# Patient Record
Sex: Male | Born: 2000 | Race: White | Hispanic: No | Marital: Single | Smoking: Never smoker
Health system: Southern US, Community
[De-identification: ages and names within clinical notes are randomized; demographics above are authoritative.]

## PROBLEM LIST (undated history)

## (undated) ENCOUNTER — Emergency Department (HOSPITAL_COMMUNITY): Admission: EM | Payer: Self-pay | Source: Home / Self Care

## (undated) DIAGNOSIS — R41840 Attention and concentration deficit: Secondary | ICD-10-CM

## (undated) DIAGNOSIS — I10 Essential (primary) hypertension: Secondary | ICD-10-CM

## (undated) HISTORY — DX: Attention and concentration deficit: R41.840

## (undated) HISTORY — DX: Essential (primary) hypertension: I10

---
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
## 2019-03-18 ENCOUNTER — Other Ambulatory Visit: Payer: Self-pay

## 2019-03-18 ENCOUNTER — Telehealth: Payer: Self-pay | Admitting: *Deleted

## 2019-03-18 ENCOUNTER — Ambulatory Visit: Payer: Medicaid Other | Attending: Internal Medicine

## 2019-03-18 DIAGNOSIS — Z20822 Contact with and (suspected) exposure to covid-19: Secondary | ICD-10-CM

## 2019-03-18 NOTE — Telephone Encounter (Signed)
Pt called to check status of covid 19 test result. He is advised that results are not back yet. Assisted with setting up his MyChart app.

## 2019-03-20 LAB — NOVEL CORONAVIRUS, NAA: SARS-CoV-2, NAA: NOT DETECTED

## 2019-03-20 NOTE — Telephone Encounter (Signed)
Patient called to check result of COVID test- notified still pending- patient does have MyChart-- reassured he will see result when complete.

## 2019-09-28 ENCOUNTER — Emergency Department (HOSPITAL_COMMUNITY)
Admission: EM | Admit: 2019-09-28 | Discharge: 2019-09-29 | Disposition: A | Discharge status: Home / Self Care | Payer: Medicaid Other | Attending: Emergency Medicine | Admitting: Emergency Medicine

## 2019-09-28 ENCOUNTER — Encounter (HOSPITAL_COMMUNITY): Payer: Self-pay | Admitting: Student

## 2019-09-28 ENCOUNTER — Other Ambulatory Visit: Payer: Self-pay

## 2019-09-28 DIAGNOSIS — Z87891 Personal history of nicotine dependence: Secondary | ICD-10-CM | POA: Diagnosis not present

## 2019-09-28 DIAGNOSIS — I10 Essential (primary) hypertension: Secondary | ICD-10-CM | POA: Insufficient documentation

## 2019-09-28 DIAGNOSIS — Z20822 Contact with and (suspected) exposure to covid-19: Secondary | ICD-10-CM | POA: Insufficient documentation

## 2019-09-28 DIAGNOSIS — R45851 Suicidal ideations: Secondary | ICD-10-CM | POA: Insufficient documentation

## 2019-09-28 DIAGNOSIS — Z0279 Encounter for issue of other medical certificate: Secondary | ICD-10-CM | POA: Diagnosis present

## 2019-09-28 LAB — RAPID URINE DRUG SCREEN, HOSP PERFORMED
Amphetamines: NOT DETECTED
Barbiturates: NOT DETECTED
Benzodiazepines: NOT DETECTED
Cocaine: NOT DETECTED
Opiates: NOT DETECTED
Tetrahydrocannabinol: POSITIVE — AB

## 2019-09-28 LAB — CBC
HCT: 50 % (ref 39.0–52.0)
Hemoglobin: 16.5 g/dL (ref 13.0–17.0)
MCH: 28.7 pg (ref 26.0–34.0)
MCHC: 33 g/dL (ref 30.0–36.0)
MCV: 87 fL (ref 80.0–100.0)
Platelets: ADEQUATE 10*3/uL (ref 150–400)
RBC: 5.75 MIL/uL (ref 4.22–5.81)
RDW: 13.2 % (ref 11.5–15.5)
WBC: 9.7 10*3/uL (ref 4.0–10.5)
nRBC: 0 % (ref 0.0–0.2)

## 2019-09-28 LAB — COMPREHENSIVE METABOLIC PANEL
ALT: 15 U/L (ref 0–44)
AST: 18 U/L (ref 15–41)
Albumin: 4.8 g/dL (ref 3.5–5.0)
Alkaline Phosphatase: 40 U/L (ref 38–126)
Anion gap: 11 (ref 5–15)
BUN: 10 mg/dL (ref 6–20)
CO2: 22 mmol/L (ref 22–32)
Calcium: 9.5 mg/dL (ref 8.9–10.3)
Chloride: 106 mmol/L (ref 98–111)
Creatinine, Ser: 0.9 mg/dL (ref 0.61–1.24)
GFR calc Af Amer: >60 mL/min (ref >60)
GFR calc non Af Amer: >60 mL/min (ref >60)
Glucose, Bld: 119 mg/dL — ABNORMAL HIGH (ref 70–99)
Potassium: 3.2 mmol/L — ABNORMAL LOW (ref 3.5–5.1)
Sodium: 139 mmol/L (ref 135–145)
Total Bilirubin: 1.1 mg/dL (ref 0.3–1.2)
Total Protein: 7.6 g/dL (ref 6.5–8.1)

## 2019-09-28 LAB — ACETAMINOPHEN LEVEL: Acetaminophen (Tylenol), Serum: <10 ug/mL — ABNORMAL LOW (ref 10–30)

## 2019-09-28 LAB — SALICYLATE LEVEL: Salicylate Lvl: <7 mg/dL — ABNORMAL LOW (ref 7.0–30.0)

## 2019-09-28 LAB — ETHANOL: Alcohol, Ethyl (B): <10 mg/dL (ref <10)

## 2019-09-28 MED ORDER — POTASSIUM CHLORIDE CRYS ER 20 MEQ PO TBCR
40.0000 meq | EXTENDED_RELEASE_TABLET | ORAL | Status: AC
  Administered 2019-09-28 (×2): 40 meq via ORAL
  Filled 2019-09-28 (×2): qty 2

## 2019-09-28 MED ORDER — POTASSIUM CHLORIDE CRYS ER 20 MEQ PO TBCR: 40.0000 meq | EXTENDED_RELEASE_TABLET | Freq: Once | ORAL | Status: DC

## 2019-09-28 MED ORDER — ONDANSETRON HCL 4 MG PO TABS: 4.0000 mg | ORAL_TABLET | Freq: Three times a day (TID) | ORAL | Status: DC | PRN

## 2019-09-28 MED ORDER — ACETAMINOPHEN 325 MG PO TABS: 650.0000 mg | ORAL_TABLET | ORAL | Status: DC | PRN

## 2019-09-28 MED ORDER — ZOLPIDEM TARTRATE 5 MG PO TABS: 5.0000 mg | ORAL_TABLET | Freq: Every evening | ORAL | Status: DC | PRN

## 2019-09-28 MED ORDER — ALUM & MAG HYDROXIDE-SIMETH 200-200-20 MG/5ML PO SUSP: 30.0000 mL | Freq: Four times a day (QID) | ORAL | Status: DC | PRN

## 2019-09-28 NOTE — BH Assessment (Signed)
Comprehensive Clinical Assessment (CCA) Note  09/28/2019 Benjamin Bryant [id number]  Visit Diagnosis: F32.2, Major depressive disorder, Single episode, Severe; F10.20, Alcohol use disorder, Severe; F12.20, Cannabis use disorder, Severe  CCA Screening, Triage and Referral (STR) Benjamin Bryant is a 19 year old patient who was voluntarily brought to [facility] via [facility] after a friend called the [facility] due to pt telling his friend that he had been drinking and that he probably wouldn't make it home tonight. Pt states, "I told my buddy that I probably wouldn't make it home tonight--I'd been drinking. It was recommended that I come down here. Me and my girlfriend broke up about 7 months ago. My sister died several months ago - it was sudden. She overdosed. I don't know what I was gonna do. I'd felt down the past 2-3 days--I don't know what I was going to do." Pt acknowledged that he'd been having thoughts of wanting to end his life the past 2-3 months; he denies he had a plan.  Pt denies any HI, AVH, NSSIB, access to guns/weapons, or engagement with the legal system. He states he has been drinking daily/nightly the last week, which is different for him, as he never drinks; he states he has been drinking around 2 12-packs of 12-ounce beers/night. Pt states he averages the use of 2 ounces of marijuana a week.  Pt's protective factors include the lack of HI, AVH, and consistent housing and employment. His mother currently lives with him.  Pt declined to provide verbal consent for clinician to contact a friend/family member for collateral information.  Pt is oriented x5. His recent/remote memory is intact. Pt was friendly and cooperative throughout the assessment process. Pt's insight is fair; his judgement and impulse control is poor.   Patient Reported Information How did you hear about [place]? Other (Comment) (Pt was brought to the ED by the [facility])  Referral name: [facility] Department  Referral phone  number: No data recorded  Whom do you see for routine medical problems? Primary Care  Practice/Facility Name: [facility] in [place]  Practice/Facility Phone Number: No data recorded Name of Contact: Pt cannot remember  Contact Number: N/A  Contact Fax Number: N/A  Prescriber Name: N/A  Prescriber Address (if known): N/A   What Is the Reason for Your Visit/Call Today? Pt was drinking and planned to leave his home, telling his friend he probably wouldn't make it home tonight.  How Long Has This Been Causing You Problems? > than 6 months (7 months)  What Do You Feel Would Help You the Most Today? No data recorded  Have You Recently Been in Any Inpatient Treatment (Hospital/Detox/Crisis Center/28-Day Program)? No  Name/Location of Program/Hospital:No data recorded How Long Were You There? No data recorded When Were You Discharged? No data recorded  Have You Ever Received Services From [facility] Before? No  Who Do You See at [facility]? No data recorded  Have You Recently Had Any Thoughts About Hurting Yourself? Yes  Are You Planning to Commit Suicide/Harm Yourself At This time? No data recorded  Have you Recently Had Thoughts About Hurting Someone [name]? No  Explanation: No data recorded  Have You Used Any Alcohol or Drugs in the Past 24 Hours? Yes  How Long Ago Did You Use Drugs or Alcohol? 1800  What Did You Use and How Much? EtOH, unsure how long ago he used marijuana   Do You Currently Have a Therapist/Psychiatrist? No  Name of Therapist/Psychiatrist: No data recorded  Have You Been  Recently Discharged From Any Public relations account executive or Programs? No  Explanation of Discharge From Practice/Program: No data recorded    CCA Screening Triage Referral Assessment Type of Contact: Tele-Assessment  Is this Initial or Reassessment? Initial Assessment  Date Telepsych consult ordered in [facility]:  09/28/19  Time Telepsych consult ordered in [facility]:  1836   Patient  Reported Information Reviewed? Yes  Patient Left Without Being Seen? No data recorded Reason for Not Completing Assessment: No data recorded  Collateral Involvement: Pt declined to provide verbal consent for clinician to contact any friends/family members   Does Patient Have a Court Appointed Legal Guardian? No data recorded Name and Contact of Legal Guardian: No data recorded If Minor and Not Living with Parent(s), Who has Custody? N/A  Is CPS involved or ever been involved? Never  Is APS involved or ever been involved? Never   Patient Determined To Be At Risk for Harm To Self or Others Based on Review of Patient Reported Information or Presenting Complaint? Yes, for Self-Harm  Method: No data recorded Availability of Means: No data recorded Intent: No data recorded Notification Required: No data recorded Additional Information for Danger to Others Potential: No data recorded Additional Comments for Danger to Others Potential: No data recorded Are There Guns or Other Weapons in Your Home? No data recorded Types of Guns/Weapons: No data recorded Are These Weapons Safely Secured?                            No data recorded Who Could Verify You Are Able To Have These Secured: No data recorded Do You Have any Outstanding Charges, Pending Court Dates, Parole/Probation? No data recorded Contacted To Inform of Risk of Harm To Self or Others: Law Enforcement Mudlogger is aware)   Location of Assessment: [facility]   Does Patient Present under Involuntary Commitment? No  IVC Papers Initial File Date: No data recorded  [place] of Residence: [place]   Patient Currently Receiving the Following Services: Not Receiving Services   Determination of Need: Emergent (2 hours)   Options For Referral: Inpatient Hospitalization     CCA Biopsychosocial  Intake/Chief Complaint:  CCA Intake With Chief Complaint CCA Part Two Date: 09/28/19 CCA Part Two Time: 2039 Chief  Complaint/Presenting Problem: Pt had been drinking at home and was planning to leave, telling his friend that he probably wouldn't make it home tonight. Patient's Currently Reported Symptoms/Problems: Pt states he has been feeling hopeless, worthless, guilty, and having difficulties sleeping. Individual's Strengths: Pt has been able to maintain employment. He loves and cares about his family. Individual's Preferences: N/A Individual's Abilities: Pt is able to work and provide for himself. Type of Services Patient Feels Are Needed: Pt is unsure of the services he needs but is open to whatever is recommended, including inpatient hospitalization. Initial Clinical Notes/Concerns: Pt has never had services in the past and is at risk of killing himself.  Mental Health Symptoms Depression:  Depression: Change in energy/activity, Fatigue, Hopelessness, Irritability, Worthlessness  Mania:  Mania: None  Anxiety:   Anxiety: Worrying  Psychosis:  Psychosis: None  Trauma:  Trauma: Guilt/shame, Difficulty staying/falling asleep  Obsessions:  Obsessions: None  Compulsions:  Compulsions: None  Inattention:  Inattention: None  Hyperactivity/Impulsivity:  Hyperactivity/Impulsivity: N/A  Oppositional/Defiant Behaviors:  Oppositional/Defiant Behaviors: None  Emotional Irregularity:  Emotional Irregularity: (P) None  Other Mood/Personality Symptoms:  Other Mood/Personality Symptoms: (P) N/A   Mental Status Exam Appearance and self-care  Stature:  Stature: (P) Average  Weight:  Weight: (P) Average weight  Clothing:  Clothing: (P)  (Pt is in scrubs)  Grooming:  Grooming: (P) Normal  Cosmetic use:  Cosmetic Use: (P) None  Posture/gait:  Posture/Gait: (P) Normal  Motor activity:  Motor Activity: (P) Not Remarkable  Sensorium  Attention:  Attention: (P) Normal  Concentration:  Concentration: (P) Normal  Orientation:  Orientation: (P) X5  Recall/memory:  Recall/Memory: (P) Normal  Affect and Mood  Affect:   Affect: (P) Appropriate  Mood:  Mood: (P) Depressed  Relating  Eye contact:  Eye Contact: (P) Normal  Facial expression:  Facial Expression: (P) Responsive  Attitude toward examiner:  Attitude Toward Examiner: (P) Cooperative  Thought and Language  Speech flow: Speech Flow: (P) Normal  Thought content:  Thought Content: (P) Appropriate to Mood and Circumstances  Preoccupation:  Preoccupations: (P) None  Hallucinations:  Hallucinations: (P) None  Organization:     Company secretaryxecutive Functions  Fund of Knowledge:     Intelligence:     Abstraction:     Judgement:     Reality Testing:     Insight:     Decision Making:     Social Functioning  Social Maturity:     Social Judgement:     Stress  Stressors:     Coping Ability:     Skill Deficits:     Supports:        Religion: Religion/Spirituality Are You A Religious Person?: Yes What is Your Religious Affiliation?: Environmental consultantBaptist  Leisure/Recreation: Leisure / Recreation Do You Have Hobbies?: No  Exercise/Diet: Exercise/Diet Do You Exercise?: No Have You Gained or Lost A Significant Amount of Weight in the Past Six Months?:  (Gained around 20 lbs and then lost 25 - 30 lbs within 6-7 months) Do You Follow a Special Diet?: No Do You Have Any Trouble Sleeping?: Yes Explanation of Sleeping Difficulties: Has difficulties falling and staying asleep   CCA Employment/Education  Employment/Work Situation: Employment / Work Situation Employment situation: Employed Where is patient currently employed?: N/A How long has patient been employed?: N/A Patient's job has been impacted by current illness: Yes Describe how patient's job has been impacted: N/A What is the longest time patient has a held a job?: N/A Where was the patient employed at that time?: N/A Has patient ever been in the [employer] and Companymilitary?: No  Education: Education Is Patient Currently Attending School?: No Last Grade Completed:  (N/A) Name of High School: N/A Did Garment/textile technologistYou Graduate  From [employer]?:  (N/A) Did You Attend College?:  (N/A) Did You Attend Graduate School?:  (N/A) Did You Have Any Special Interests In School?: N/A Did You Have An Individualized Education Program (IIEP):  (N/A) Did You Have Any Difficulty At School?:  (N/A) Patient's Education Has Been Impacted by Current Illness:  (N/A)   CCA Family/Childhood History  Family and Relationship History: Family history Marital status: Single Are you sexually active?:  (N/A) What is your sexual orientation?: N/A Has your sexual activity been affected by drugs, alcohol, medication, or emotional stress?: N/A Does patient have children?: No  Childhood History:  Childhood History By whom was/is the patient raised?: Both parents Additional childhood history information: N/A Description of patient's relationship with caregiver when they were a child: N/A Patient's description of current relationship with people who raised him/her: Pt's parents separated 3 years ago; it was difficult for him initially but it has gotten easier for him to cope with now. How were you disciplined when you  got in trouble as a child/adolescent?: N/A Does patient have siblings?: Yes Number of Siblings: 4 Description of patient's current relationship with siblings: Pt is close with all of his siblings; one sister died 7 months ago from an o/d. Did patient suffer any verbal/emotional/physical/sexual abuse as a child?: No Did patient suffer from severe childhood neglect?: No Has patient ever been sexually abused/assaulted/raped as an adolescent or adult?: No Was the patient ever a victim of a crime or a disaster?: No Witnessed domestic violence?: No Has patient been affected by domestic violence as an adult?: No  Child/Adolescent Assessment:     CCA Substance Use  Alcohol/Drug Use:  ASAM's:  Six Dimensions of Multidimensional Assessment  Dimension 1:  Acute Intoxication and/or Withdrawal Potential:      Dimension 2:   Biomedical Conditions and Complications:      Dimension 3:  Emotional, Behavioral, or Cognitive Conditions and Complications:     Dimension 4:  Readiness to Change:     Dimension 5:  Relapse, Continued use, or Continued Problem Potential:     Dimension 6:  Recovery/Living Environment:     ASAM Severity Score:    ASAM Recommended Level of Treatment:     Substance use Disorder (SUD)    Recommendations for Services/Supports/Treatments: Nira Conn, NP, reviewed pt's chart and information and determined pt meets inpatient criteria. Pt's referral information is currently pending at [facility]. This information was provided to pt's EDP, Dr. Effie Shy, PA-C, Harvie Heck, and RN, [employer] via internal messenger at [phone].  DSM5 Diagnoses: Patient Active Problem List   Diagnosis Date Noted  . Attention deficit hyperactivity disorder (ADHD) 07/16/2015    Patient Centered Plan: Patient is on the following Treatment Plan(s):  Depression and Substance Abuse   Referrals to Alternative Service(s): Referred to Alternative Service(s):   Place:   Date:   Time:    Referred to Alternative Service(s):   Place:   Date:   Time:    Referred to Alternative Service(s):   Place:   Date:   Time:    Referred to Alternative Service(s):   Place:   Date:   Time:     Ralph Dowdy

## 2019-09-28 NOTE — ED Notes (Signed)
TTS in progress 

## 2019-09-28 NOTE — ED Triage Notes (Signed)
Patient comes to the ED accompanied by [facility] voluntarily after telling his friend he "may not make it home tonight".  Patient friend called [employer] who recommended patient come to the ED for evaluation.  Patient calm cooperative in triage. Patient reports he felt like a lot of personal things are happening at once, with his family and girlfriend and had a plan to drink and "drive around".

## 2019-09-28 NOTE — ED Notes (Addendum)
Pt reports he broke up with his girlfriend of 7 years about a month ago   Reports he told his friend he might not make it home tonight   Friend call police who encouraged and escorted pt here   He reports he was just talking  He has no hx of in/out pt   No SI/HI A/V/T hallucinations  And is not threatening self harm as he was just talking   Urine obtained

## 2019-09-28 NOTE — ED Provider Notes (Signed)
[facility] EMERGENCY DEPARTMENT Provider Note   CSN: [id number] Arrival date & time: 09/28/19  1713     History Chief Complaint  Patient presents with  . Medical Clearance    Benjamin Bryant is a 19 y.o. male with a history of hypertension & ADHD who presents to the ED by police for evaluation of suicidal ideation.  Patient states that he has had problems with depression for the past several months with intermittent thoughts of suicide.  He states he had a lot of personal things going on and had a break-up with a girlfriend that really upset him.  He states that tonight he had a few alcoholic beverages and then decided to drive, he is unsure if he was trying to commit suicide by doing this, he was contemplating it.  No other attempts made.  No other alleviating or aggravating factors.  He was suicidal upon arrival, however upon my assessment the patient he does not actively want to commit suicide.  He denies homicidal ideation or hallucinations.  Utilizes marijuana, no other drug use.  HPI     Past Medical History:  Diagnosis Date  . Attention deficit   . Hypertension     Patient Active Problem List   Diagnosis Date Noted  . Attention deficit hyperactivity disorder (ADHD) 07/16/2015    No past surgical history on file.     Family History  Problem Relation Age of Onset  . Hypertension Mother   . COPD Father   . COPD Maternal Grandmother   . Hypertension Maternal Grandmother   . Heart disease Maternal Grandmother   . Hypertension Paternal Grandmother   . Cancer Paternal Grandmother     Social History   Tobacco Use  . Smoking status: Never Smoker  . Smokeless tobacco: Former Clinical biochemist  . Vaping Use: Never used  Substance Use Topics  . Alcohol use: No    Comment: Occassionally  . Drug use: No    Home Medications Prior to Admission medications   Medication Sig Start Date End Date Taking? Authorizing Provider  amphetamine-dextroamphetamine (ADDERALL) 20  MG tablet Take 20 mg by mouth 3 (three) times daily.    [provider]  cyclobenzaprine (FLEXERIL) 5 MG tablet Take 1 tablet (5 mg total) by mouth 3 (three) times daily as needed for muscle spasms. 04/11/18   Burgess Amor, PA-C  famotidine (PEPCID) 20 MG tablet Take 1 tablet (20 mg total) by mouth 2 (two) times daily. 10/28/14   Devoria Albe, MD  ibuprofen (ADVIL,MOTRIN) 600 MG tablet Take 1 tablet (600 mg total) by mouth every 6 (six) hours as needed. 04/11/18   Burgess Amor, PA-C  loperamide (IMODIUM) 2 MG capsule Take 1 capsule (2 mg total) by mouth 4 (four) times daily as needed for diarrhea or loose stools. 09/04/17   Mesner, Barbara Cower, MD  naproxen (NAPROSYN) 500 MG tablet Take 1 tablet (500 mg total) by mouth 2 (two) times daily. 12/24/18   Chryl Holten R, PA-C  ondansetron (ZOFRAN) 4 MG tablet Take 1 tablet (4 mg total) by mouth every 8 (eight) hours as needed for nausea or vomiting. 09/04/17   Mesner, Barbara Cower, MD    Allergies    Patient has no known allergies.  Review of Systems   Review of Systems  Constitutional: Negative for chills and fever.  Respiratory: Negative for shortness of breath.   Cardiovascular: Negative for chest pain.  Gastrointestinal: Negative for abdominal pain.  Neurological: Negative for syncope.  Psychiatric/Behavioral: Positive for  suicidal ideas. Negative for hallucinations.  All other systems reviewed and are negative.   Physical Exam Updated Vital Signs BP (!) 144/83 (BP Location: Right Arm)   Pulse 65   Temp 98.8 F (37.1 C) (Oral)   Resp 16   Ht 6\' 2"  (1.88 m)   Wt 86.2 kg   SpO2 100%   BMI 24.39 kg/m   Physical Exam Vitals and nursing note reviewed.  Constitutional:      General: He is not in acute distress.    Appearance: He is well-developed. He is not toxic-appearing.  HENT:     Head: Normocephalic and atraumatic.  Eyes:     General:        Right eye: No discharge.        Left eye: No discharge.     Conjunctiva/sclera:  Conjunctivae normal.  Cardiovascular:     Rate and Rhythm: Normal rate and regular rhythm.  Pulmonary:     Effort: Pulmonary effort is normal. No respiratory distress.     Breath sounds: Normal breath sounds. No wheezing, rhonchi or rales.  Abdominal:     General: There is no distension.     Palpations: Abdomen is soft.     Tenderness: There is no abdominal tenderness.  Musculoskeletal:     Cervical back: Neck supple.  Skin:    General: Skin is warm and dry.     Findings: No rash.  Neurological:     Mental Status: He is alert.     Comments: Clear speech.   Psychiatric:        Mood and Affect: Affect is flat.        Behavior: Behavior normal.     ED Results / Procedures / Treatments   Labs (all labs ordered are listed, but only abnormal results are displayed) Labs Reviewed  COMPREHENSIVE METABOLIC PANEL - Abnormal; Notable for the following components:      Result Value   Potassium 3.2 (*)    Glucose, Bld 119 (*)    All other components within normal limits  SALICYLATE LEVEL - Abnormal; Notable for the following components:   Salicylate Lvl <7.0 (*)    All other components within normal limits  ACETAMINOPHEN LEVEL - Abnormal; Notable for the following components:   Acetaminophen (Tylenol), Serum <10 (*)    All other components within normal limits  RAPID URINE DRUG SCREEN, HOSP PERFORMED - Abnormal; Notable for the following components:   Tetrahydrocannabinol POSITIVE (*)    All other components within normal limits  SARS CORONAVIRUS 2 BY RT PCR (HOSPITAL ORDER, PERFORMED IN [facility])  ETHANOL  CBC    EKG None  Radiology No results found.  Procedures Procedures (including critical care time)  Medications Ordered in ED Medications - No data to display  ED Course  I have reviewed the triage vital signs and the nursing notes.  Pertinent labs & imaging results that were available during my care of the patient were reviewed by me and considered  in my medical decision making (see chart for details). \ Benjamin Bryant was evaluated in Emergency Department on 09/28/2019 for the symptoms described in the history of present illness. He/she was evaluated in the context of the global COVID-19 pandemic, which necessitated consideration that the patient might be at risk for infection with the SARS-CoV-2 virus that causes COVID-19. Institutional protocols and algorithms that pertain to the evaluation of patients at risk for COVID-19 are in a state of rapid change based on  information released by regulatory bodies including the CDC and federal and state organizations. These policies and algorithms were followed during the patient's care in the ED.    MDM Rules/Calculators/A&P                         Patient presents to the ED for psychiatric evaluation.  Patient is nontoxic, resting comfortably, vitals within normal limits with the exception of elevated blood pressure, low suspicion for hypertensive emergency.  Physical exam otherwise benign.  Flat affect noted..   Additional history obtained:  Additional history obtained from nursing note & chart review. Lab Tests:  I Ordered, reviewed, and interpreted labs, which included:  CBC, CMP, ethanol level, UDS, salicylate level, and acetaminophen level, fairly unremarkable, mild hypokalemia, oral replacement ordered.  UDS consistent with patient reported marijuana use. Covid test is pending  ED Course:  Patient medically cleared for TTS evaluation.  Disposition per [facility].   The patient has been placed in psychiatric observation due to the need to provide a safe environment for the patient while obtaining psychiatric consultation and evaluation, as well as ongoing medical and medication management to treat the patient's condition.  The patient has not been placed under full IVC at this time.  Portions of this note were generated with Scientist, clinical (histocompatibility and immunogenetics). Dictation errors may occur despite best  attempts at proofreading.  Final Clinical Impression(s) / ED Diagnoses Final diagnoses:  None    Rx / DC Orders ED Discharge Orders    None       Desmond Lope 09/28/19 Jaynee Eagles, MD 09/29/19 1137

## 2019-09-28 NOTE — Progress Notes (Signed)
Pt accepted to [facility] to room 399-01 to the service of MD Clary after 0800 hours, pending negative COVID result.  Report may be called to [phone] when transportation is arranged.

## 2019-09-28 NOTE — ED Notes (Signed)
Dressed patient in burgundy scrubs. Patients belongings are locked in the locker.

## 2019-09-29 ENCOUNTER — Inpatient Hospital Stay (HOSPITAL_COMMUNITY)
Admission: AD | Admit: 2019-09-29 | Discharge: 2019-10-01 | DRG: 885 | Disposition: A | Discharge status: Home / Self Care | Payer: Medicaid Other | Source: Intra-hospital | Attending: Psychiatry | Admitting: Psychiatry

## 2019-09-29 ENCOUNTER — Other Ambulatory Visit: Payer: Self-pay

## 2019-09-29 ENCOUNTER — Encounter (HOSPITAL_COMMUNITY): Payer: Self-pay | Admitting: Nurse Practitioner

## 2019-09-29 DIAGNOSIS — G4700 Insomnia, unspecified: Secondary | ICD-10-CM | POA: Diagnosis present

## 2019-09-29 DIAGNOSIS — F411 Generalized anxiety disorder: Secondary | ICD-10-CM | POA: Diagnosis present

## 2019-09-29 DIAGNOSIS — R45851 Suicidal ideations: Secondary | ICD-10-CM | POA: Diagnosis present

## 2019-09-29 DIAGNOSIS — F322 Major depressive disorder, single episode, severe without psychotic features: Secondary | ICD-10-CM | POA: Diagnosis present

## 2019-09-29 DIAGNOSIS — F909 Attention-deficit hyperactivity disorder, unspecified type: Secondary | ICD-10-CM | POA: Diagnosis present

## 2019-09-29 DIAGNOSIS — I10 Essential (primary) hypertension: Secondary | ICD-10-CM | POA: Diagnosis present

## 2019-09-29 DIAGNOSIS — Z818 Family history of other mental and behavioral disorders: Secondary | ICD-10-CM

## 2019-09-29 LAB — SARS CORONAVIRUS 2 BY RT PCR (HOSPITAL ORDER, PERFORMED IN ~~LOC~~ HOSPITAL LAB): SARS Coronavirus 2: NEGATIVE

## 2019-09-29 MED ORDER — ALUM & MAG HYDROXIDE-SIMETH 200-200-20 MG/5ML PO SUSP: 30.0000 mL | ORAL | Status: DC | PRN

## 2019-09-29 MED ORDER — ENSURE ENLIVE PO LIQD
237.0000 mL | Freq: Two times a day (BID) | ORAL | Status: DC
  Administered 2019-09-30 (×2): 237 mL via ORAL

## 2019-09-29 MED ORDER — HYDROXYZINE HCL 25 MG PO TABS: 25.0000 mg | ORAL_TABLET | Freq: Three times a day (TID) | ORAL | Status: DC | PRN

## 2019-09-29 MED ORDER — ACETAMINOPHEN 325 MG PO TABS
650.0000 mg | ORAL_TABLET | Freq: Four times a day (QID) | ORAL | Status: DC | PRN
  Administered 2019-10-01: 650 mg via ORAL
  Filled 2019-09-29: qty 2

## 2019-09-29 MED ORDER — MAGNESIUM HYDROXIDE 400 MG/5ML PO SUSP: 30.0000 mL | Freq: Every day | ORAL | Status: DC | PRN

## 2019-09-29 MED ORDER — TRAZODONE HCL 50 MG PO TABS: 50.0000 mg | ORAL_TABLET | Freq: Every evening | ORAL | Status: DC | PRN

## 2019-09-29 NOTE — ED Notes (Signed)
Attempted to call Drue Flirt ( mother ) to give update but no answer or voicemail

## 2019-09-29 NOTE — ED Notes (Signed)
Pt requesting shower. Provided him with body wash, shampoo, and lotion per request. Towel, wash cloth, and clean scrubs provided.

## 2019-09-29 NOTE — Progress Notes (Signed)
The patient had nothing to share with regards to his day. His goal for tomorrow is to stop drinking and using marijuana.

## 2019-09-29 NOTE — ED Notes (Signed)
Pt. Currently in shower, 1-1 monitoring from outside bathroom door.

## 2019-09-29 NOTE — ED Notes (Signed)
Transportation is going to take a little longer d/t raining.

## 2019-09-29 NOTE — ED Notes (Signed)
Pt has been resting quietly. °

## 2019-09-29 NOTE — ED Notes (Signed)
Meal tray provided for patient. Finger foods only on tray

## 2019-09-29 NOTE — BH Assessment (Addendum)
Patient referred to the following hospitals for consideration of bed placement:  [facility]  [facility]  [facility]  [facility]  [facility]  [facility]  [facility]  [facility]  [facility]  [facility]  [facility]  [facility]

## 2019-09-29 NOTE — Progress Notes (Signed)
Patient ID: Benjamin Bryant, male   DOB: Mar 17, 2000, 19 y.o.   MRN: [id number] D: Pt here voluntarily from [facility]. Pt denies HI/AVH and pain at this time. Pt endorses SI without a plan. Pt agrees to approach staff if he feels like harming himself. Pt endorses helplessness and hopelessness. Pt has lost 20 pounds in the past month due to a decreased appetite. Pt lost his sister to OD death a few months ago. His mother lives with him and now so does his deceases sister's 3 children. Pt broke up with his girlfriend 7 months ago after a 7 year relationship. Pt stated he began drinking alcohol about a week ago for the first time. Pt denies even having a desire to drink. Pt endorses drinking 14 beers yesterday and even more in the days preceding that. Pt endorses marijuana use and vapes.  Pt is looking for counseling. Pt wants someone to talk to so he came here. This is his first admission at [facility] according to pt. Pt takes Adderall but has not taken it in the past 2 days. A: Pt was offered support and encouragement. Pt is cooperative during assessment. VS assessed and admission paperwork signed. Belongings searched and contraband items placed in locker. Non-invasive skin search completed: pt has tattoos on both arms. Pt offered food and drink and both accepted. Pt introduced to unit milieu by nursing staff. Q 15 minute checks were started for safety.  R: Pt in room eating. Pt safety maintained on unit.

## 2019-09-29 NOTE — ED Notes (Signed)
Pt belongings give to RN at nurses station for transportation. Pt shoes will be given to wear to the [facility].

## 2019-09-29 NOTE — ED Notes (Addendum)
Transportation on hold d/t [facility] doesn't have a empty room yet. [facility] will call when available.

## 2019-09-29 NOTE — ED Notes (Signed)
Nurse spoke with [facility] and pt room is ready. Room 305-1. Transportation will be called again.

## 2019-09-29 NOTE — Progress Notes (Addendum)
   09/29/19 1925  Psych Admission Type (Psych Patients Only)  Admission Status Voluntary  Psychosocial Assessment  Patient Complaints Hopelessness;Helplessness  Eye Contact Fair  Facial Expression Sad  Affect Appropriate to circumstance  Speech Logical/coherent  Interaction Assertive  Motor Activity Other (Comment) (WNL)  Appearance/Hygiene Unremarkable;In scrubs  Behavior Characteristics Cooperative  Mood Depressed;Sad  Thought Process  Coherency WDL  Content WDL  Delusions None reported or observed  Perception WDL  Hallucination None reported or observed  Judgment Poor  Confusion None  Danger to Self  Current suicidal ideation? Passive  Self-Injurious Behavior No self-injurious ideation or behavior indicators observed or expressed   Agreement Not to Harm Self Yes  Description of Agreement verbal agreement to approach staff  Danger to Others  Danger to Others None reported or observed   Pt pleasant. Pt states he takes Adderall daily but has not taken it in the past 2 days. Pt has history of hypertension and ADHD. Pt also has fractured  Vertebrae in his lower back d/t workplace fall at his last job about 3-4 months ago. Pt states he has some weakness in his legs but has not had any falls or near falls because of it.

## 2019-09-29 NOTE — ED Notes (Signed)
Pt is resting at this time

## 2019-09-29 NOTE — ED Notes (Signed)
Patient's mother called and spoke with him.  Mother's number written down for patient.  Mother Drue Flirt) [phone]

## 2019-09-29 NOTE — Tx Team (Signed)
Initial Treatment Plan 09/29/2019 11:00 PM HERSHY FLENNER [id number]    PATIENT STRESSORS: Loss of sister to OD several months ago; breakup with GF 7 months ago   PATIENT STRENGTHS: Average or above average intelligence Capable of independent living Communication skills Motivation for treatment/growth Supportive family/friends   PATIENT IDENTIFIED PROBLEMS: Suicidal ideation  Depression   Breakup with GF 7 months ago  Sister OD death several months ago               DISCHARGE CRITERIA:  Improved stabilization in mood, thinking, and/or behavior Motivation to continue treatment in a less acute level of care Verbal commitment to aftercare and medication compliance  PRELIMINARY DISCHARGE PLAN: Attend aftercare/continuing care group Attend PHP/IOP Outpatient therapy Return to previous living arrangement Return to previous work or school arrangements  PATIENT/FAMILY INVOLVEMENT: This treatment plan has been presented to and reviewed with the patient, GLOVER CAPANO, and/or family member.  The patient and family have been given the opportunity to ask questions and make suggestions.  Victorino December, RN 09/29/2019, 11:00 PM

## 2019-09-30 DIAGNOSIS — F322 Major depressive disorder, single episode, severe without psychotic features: Principal | ICD-10-CM

## 2019-09-30 LAB — LIPID PANEL
Cholesterol: 115 mg/dL (ref 0–200)
HDL: 46 mg/dL (ref >40)
LDL Cholesterol: 56 mg/dL (ref 0–99)
Total CHOL/HDL Ratio: 2.5 RATIO
Triglycerides: 63 mg/dL (ref <150)
VLDL: 13 mg/dL (ref 0–40)

## 2019-09-30 LAB — HEMOGLOBIN A1C
Hgb A1c MFr Bld: 4.8 % (ref 4.8–5.6)
Mean Plasma Glucose: 91.06 mg/dL

## 2019-09-30 LAB — TSH: TSH: 2.082 u[IU]/mL (ref 0.350–4.500)

## 2019-09-30 MED ORDER — LORAZEPAM 1 MG PO TABS: 1.0000 mg | ORAL_TABLET | Freq: Four times a day (QID) | ORAL | Status: DC | PRN

## 2019-09-30 MED ORDER — ADULT MULTIVITAMIN W/MINERALS CH
1.0000 | ORAL_TABLET | Freq: Every day | ORAL | Status: DC
  Administered 2019-09-30 – 2019-10-01 (×2): 1 via ORAL
  Filled 2019-09-30 (×5): qty 1

## 2019-09-30 MED ORDER — THIAMINE HCL 100 MG PO TABS
100.0000 mg | ORAL_TABLET | Freq: Every day | ORAL | Status: DC
  Administered 2019-10-01: 100 mg via ORAL
  Filled 2019-09-30 (×3): qty 1

## 2019-09-30 MED ORDER — CITALOPRAM HYDROBROMIDE 10 MG PO TABS
10.0000 mg | ORAL_TABLET | Freq: Every day | ORAL | Status: DC
  Administered 2019-09-30 – 2019-10-01 (×2): 10 mg via ORAL
  Filled 2019-09-30 (×5): qty 1

## 2019-09-30 MED ORDER — LOPERAMIDE HCL 2 MG PO CAPS: 2.0000 mg | ORAL_CAPSULE | ORAL | Status: DC | PRN

## 2019-09-30 MED ORDER — ONDANSETRON 4 MG PO TBDP: 4.0000 mg | ORAL_TABLET | Freq: Four times a day (QID) | ORAL | Status: DC | PRN

## 2019-09-30 MED ORDER — THIAMINE HCL 100 MG/ML IJ SOLN
100.0000 mg | Freq: Once | INTRAMUSCULAR | Status: DC
Start: 2019-09-30 — End: 2019-09-30

## 2019-09-30 MED ORDER — HYDROXYZINE HCL 25 MG PO TABS
25.0000 mg | ORAL_TABLET | Freq: Four times a day (QID) | ORAL | Status: DC | PRN
  Administered 2019-09-30: 25 mg via ORAL
  Filled 2019-09-30: qty 1

## 2019-09-30 NOTE — Progress Notes (Signed)
The patient shared in group this evening that he learned about anti depressants today. His goal for tomorrow is to get better and to get discharged.

## 2019-09-30 NOTE — Progress Notes (Signed)
NUTRITION ASSESSMENT RD working remotely.  Pt identified as at risk on the Malnutrition Screen Tool  INTERVENTION: - continue Ensure Enlive BID, each supplement provides 350 kcal and 20 grams of protein.  NUTRITION DIAGNOSIS: Unintentional weight loss related to sub-optimal intake as evidenced by pt report.   Goal: Pt to meet >/= 90% of their estimated nutrition needs.  Monitor:  PO intake  Assessment:  Patient admitted for SI without a plan with feelings of hopelessness and helplessness. Notes indicate patient reported his sister died after an OD a few months ago. He and his former girlfriend of 7 years broke up 7 months ago. He reported to staff that he drinks 14 beers the day PTA and more than that in the previous days.   He reported to [facility] staff that he has had a decreased appetite and that he has lost 20 lb in the past 1 month.   Weight today is 184 lb and PTA the most recently documented weight was on 12/24/18 when he weighed 199 lb. This indicates 15 lb weight loss (7.5% body weight).   Ensure Enlive ordered BID but has not yet been offered.   19 y.o. male  Height: Ht Readings from Last 1 Encounters:  09/29/19 6\' 2"  (1.88 m)    Weight: Wt Readings from Last 1 Encounters:  09/30/19 83.6 kg    Weight Hx: Wt Readings from Last 10 Encounters:  09/30/19 83.6 kg  09/28/19 86.2 kg  12/24/18 90.7 kg (92 %, Z= 1.42)*  04/11/18 86.2 kg (89 %, Z= 1.23)*  09/04/17 88.5 kg (92 %, Z= 1.42)*  03/27/17 78 kg (80 %, Z= 0.86)*  07/16/15 90.8 kg (97 %, Z= 1.91)*  10/28/14 86.2 kg (97 %, Z= 1.88)*  05/24/14 75.3 kg (92 %, Z= 1.41)*  12/19/13 71.2 kg (91 %, Z= 1.31)*   * Growth percentiles are based on CDC (Boys, 2-20 Years) data.    BMI:  Body mass index is 23.66 kg/m. Pt meets criteria for normal weight based on current BMI.  Estimated Nutritional Needs: Kcal: 25-30 kcal/kg Protein: > 1 gram protein/kg Fluid: 1 ml/kcal  Diet Order:  Diet Order            Diet  regular Room service appropriate? Yes; Fluid consistency: Thin  Diet effective now                Pt is also offered choice of unit snacks mid-morning and mid-afternoon.  Pt is eating as desired.   Lab results and medications reviewed.      02/18/14, MS, RD, LDN, CNSC Inpatient Clinical Dietitian RD pager # available in [facility]  After hours/weekend pager # available in [facility]

## 2019-09-30 NOTE — Progress Notes (Signed)
D:  Patient's self inventory sheet, patient has fair sleep, no sleep medication.  Poor appetite, low energy level, poor concentration.  Rated depression, hopeless and anxiety #9.  Denied withdrawals, diarrhea, cramping, agitation, nausea listed.  Denied SI.  Physical pain, pain, worst pain #5 in past 24 hours.  Goal is feel better.  No discharge plans. A:  Medications administered per MD orders.  Emotional support and encouragement given patient. R:  Patient denied SI and HI, contracts for safety.   Denied A/V hallucinations.  Safety maintained with 15 minute checks.

## 2019-09-30 NOTE — Plan of Care (Signed)
Nurse discussed anxiety, depression and coping skills with patient.  

## 2019-09-30 NOTE — Progress Notes (Signed)
Progress note  Pt states they spoke with their employer and they told the pt they could only cover their job until Thursday. Pt expresses concern with leaving today or tomorrow, the 21st, as they just acquired this job and need it.

## 2019-09-30 NOTE — Progress Notes (Signed)
Recreation Therapy Notes  Animal-Assisted Activity (AAA) Program Checklist/Progress Notes Patient Eligibility Criteria Checklist & Daily Group note for Rec Tx Intervention  Date: 7.20.21 Time: 1430 Location: 300 Hall Group Room   AAA/T Program Assumption of Risk Form signed by Patient/ or Parent Legal Guardian  YES   Patient is free of allergies or sever asthma  YES   Patient reports no fear of animals  YES   Patient reports no history of cruelty to animals  YES  Patient understands his/her participation is voluntary  YES   Patient washes hands before animal contact  YES  Patient washes hands after animal contact  YES    Behavioral Response: Engaged  Education: Hand Washing, Appropriate Animal Interaction   Education Outcome: Acknowledges understanding/In group clarification offered/Needs additional education.   Clinical Observations/Feedback: Pt attended and participated in group activity.    Athalee Esterline, LRT/CTRS         Destanie Tibbetts A 09/30/2019 3:21 PM 

## 2019-09-30 NOTE — BHH Suicide Risk Assessment (Signed)
[facility] Admission Suicide Risk Assessment   Nursing information obtained from:  Patient Demographic factors:  Male, Caucasian, Unemployed, Adolescent or young adult Current Mental Status:  Suicidal ideation indicated by patient Loss Factors:  Loss of significant relationship Historical Factors:  NA Risk Reduction Factors:  Positive social support, Employed, Sense of responsibility to family, Living with another person, especially a relative, Responsible for children under 43 years of age  Total Time spent with patient: 45 minutes Principal Problem:Diagnosis:  Active Problems:   Severe major depression, single episode, without psychotic features (HCC)  Subjective Data:   Continued Clinical Symptoms:  Alcohol Use Disorder Identification Test Final Score (AUDIT): 4 The "Alcohol Use Disorders Identification Test", Guidelines for Use in Primary Care, Second Edition.  World Science writer [facility]). Score between 0-7:  no or low risk or alcohol related problems. Score between 8-15:  moderate risk of alcohol related problems. Score between 16-19:  high risk of alcohol related problems. Score 20 or above:  warrants further diagnostic evaluation for alcohol dependence and treatment.   CLINICAL FACTORS:  19 year old male, presented to ED on 7/18 reporting worsening depression, suicidal ideations (described as passive/no plan or intention), neurovegetative symptoms, and heavy drinking (up to 24 beers per day) over the last week or 2 (without prior history of alcohol use disorder).  He reports he has been feeling depressed following break-up with girlfriend about 6 months ago.  He also reports a sister passed away from an accidental drug overdose a few weeks prior. He reports history of ADHD for which he has been prescribed Adderall since adolescence but states he has not been taking over recent days.    Psychiatric Specialty Exam: Physical Exam  Review of Systems  Blood pressure 117/88, pulse 80,  temperature 98.5 F (36.9 C), temperature source Oral, resp. rate 18, height 6\' 2"  (1.88 m), weight 83.6 kg.Body mass index is 23.66 kg/m.  See admit note MSE    COGNITIVE FEATURES THAT CONTRIBUTE TO RISK:  Closed-mindedness and Loss of executive function    SUICIDE RISK:   Moderate:  Frequent suicidal ideation with limited intensity, and duration, some specificity in terms of plans, no associated intent, good self-control, limited dysphoria/symptomatology, some risk factors present, and identifiable protective factors, including available and accessible social support.  PLAN OF CARE: Patient will be admitted to inpatient psychiatric unit for stabilization and safety. Will provide and encourage milieu participation. Provide medication management and maked adjustments as needed.  We will also provide medication management to address alcohol withdrawal if needed will follow daily.    I certify that inpatient services furnished can reasonably be expected to improve the patient's condition.   , MD 09/30/2019, 12:29 PM

## 2019-09-30 NOTE — BHH Counselor (Signed)
Adult Comprehensive Assessment  Patient ID: LEOPOLDO MAZZIE, male   DOB: [birth year], 19 y.o.   MRN: [id number]  Information Source: Information source: Patient  Current Stressors:  Patient states their primary concerns and needs for treatment are:: Pt states he drank 12pk beer and was going to drive his vehicle while intoxicated and his friend called the police. Pt reports he has been drinking heavily for the past 3 days. Pt states main stressors are recent loss of his sister and a recent break up with girlfriend Patient states their goals for this hospitilization and ongoing recovery are:: "To feel better" Educational / Learning stressors: None reported Employment / Job issues: None reported Family Relationships: No stressor reported Surveyor, quantity / Lack of resources (include bankruptcy): Limited income Housing / Lack of housing: None reported Physical health (include injuries & life threatening diseases): None reported Social relationships: No stressor reported Substance abuse: Pt reports daily marijuana use(6-7 blunts per day), alcohol use(24 beers per day) Bereavement / Loss: Sister passed away 2 months ago  Living/Environment/Situation:  Living Arrangements: Alone Who else lives in the home?: Pt, mother How long has patient lived in current situation?: 7 months What is atmosphere in current home: Comfortable, Supportive  Family History:  Marital status: Long term relationship Long term relationship, how long?: 7 yrs What types of issues is patient dealing with in the relationship?: Pt reports recent break up with girlfriend, had been dating 7 yrs. Pt states his girlfriend moved to [place] and said "she wasnt happy anymore" Does patient have children?: No  Childhood History:  By whom was/is the patient raised?: Both parents Additional childhood history information: Pt reports parents are separated and says he does not speak with them about things that are bothering him Description of  patient's relationship with caregiver when they were a child: "Good" Patient's description of current relationship with people who raised him/her: "Good" Number of Siblings: 3 Description of patient's current relationship with siblings: 2 bros, 1 sis limited interaction. Pt states he was closest to his deceased sister Did patient suffer any verbal/emotional/physical/sexual abuse as a child?: No Did patient suffer from severe childhood neglect?: No Has patient ever been sexually abused/assaulted/raped as an adolescent or adult?: No Was the patient ever a victim of a crime or a disaster?: No Witnessed domestic violence?: No Has patient been affected by domestic violence as an adult?: No  Education:  Highest grade of school patient has completed: 9th Currently a student?: No Learning disability?: No  Employment/Work Situation:   Employment situation: Employed Where is patient currently employed?: [employer] How long has patient been employed?: 3 weeks Patient's job has been impacted by current illness: Yes Describe how patient's job has been impacted: Pt states he wakes up and is not motivated to go to work What is the longest time patient has a held a job?: 2.5 yrs 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 [place] Has patient ever been in the [employer]?: No  Financial Resources:   Surveyor, quantity resources: [employer], Income from employment Does patient have a Lawyer or guardian?: No  Alcohol/Substance Abuse:   What has been your use of drugs/alcohol within the last 12 months?: Alcohol, marijuana If attempted suicide, did drugs/alcohol play a role in this?: Yes Alcohol/Substance Abuse Treatment Hx: Denies past history Has alcohol/substance abuse ever caused legal problems?: No  Social Support System:   [employer] Support System: None Type of faith/religion: Ephriam Knuckles How does patient's faith help to  cope with current  illness?: "It doesnt"  Leisure/Recreation:      Strengths/Needs:   What is the patient's perception of their strengths?: "Smoking marijuana" Patient states they can use these personal strengths during their treatment to contribute to their recovery: "I dont know"  Discharge Plan:   Currently receiving community mental health services: No Patient states concerns and preferences for aftercare planning are: Pt denies having a mental health provider and request referral for outpatient treatment Patient states they will know when they are safe and ready for discharge when: "I dont know" Does patient have access to transportation?: Yes Does patient have financial barriers related to discharge medications?: No Will patient be returning to same living situation after discharge?: Yes  Summary/Recommendations:   Summary and Recommendations (to be completed by the evaluator): Pt is a 19 yr old male who voluntarily presents to the [facility] ED after a friend called the police due to the patient trying to drive while under the influence of alcohol. Pt states he had drunk 12 beers. Pt reports increase in alcohol use stems from recent loss of his sister and recent break up with girlfriend. Pt reports a history of alcohol and marijuana use. Pt denies having a mental health provider and request referral for outpatient treatment. Pt states this is his first inpatient psychiatric hospitalization. Recommendations for pt include: crisis stabilization, therapeutic milieu, encourage group attendance and participation, medication management for mood stabilization, and development for comprehensive mental wellness plan. CSW assessing for appropriate referrals.  Tejas Seawood Philip Aspen. 09/30/2019

## 2019-09-30 NOTE — H&P (Signed)
Psychiatric Admission Assessment Adult  Patient Identification: Benjamin Bryant MRN:  [id number] Date of Evaluation:  09/30/2019 Chief Complaint: Depression, Anxiety Principal Diagnosis: MDD, no psychotic features  Diagnosis:  Active Problems:   Severe major depression, single episode, without psychotic features (HCC)  History of Present Illness: 3y old male, presented to ED on 7/18 ( voluntarily, at encouragement of friends), after expressing suicidal ideations . Patient reports he has been experiencing suicidal ideations recently, which he describes as passive ( states " I didn't care whatever happened") . States he has been feeling depressed over the last 6-7 months following break up with GF, whom he had been dating for several years. States they continued amicable relationship after break up but states " she stopped talking to me a few weeks ago". He also reports that one of his sisters passed away a few weeks ago from an accidental drug overdose . He denies prior history of alcohol use disorder but states that over the last week he has been drinking up to 24 beers per day. ( admission BAL negative, UDS positive for cannabis)  Endorses neuro-vegetative symptoms of depression as below. Denies psychotic symptoms.  Associated Signs/Symptoms: Depression Symptoms:  depressed mood, anhedonia, insomnia, suicidal thoughts without plan, anxiety, panic attacks, loss of energy/fatigue, decreased appetite, reports has lost close to 20 lbs over recent weeks (Hypo) Manic Symptoms: none endorsed or noted at this time Anxiety Symptoms: reports increased anxiety recently, with increased sense of apprehension and recent panic attacks Psychotic Symptoms: denies  PTSD Symptoms: Denies  Total Time spent with patient: 45 minutes  Past Psychiatric History: no prior psychiatric admissions . Denies prior history of suicide attempts, denies history of self cutting.Denies prior history of severe depressive  episodes and as noted states he has felt depressed over recent months following break up with GF .  He reports history of anxiety, but characterizes as mild up to recently, when he has had increased worry/anxiety and some panic attacks.Denies anhedonia. Denies history of mania or hypomania. Reports history of ADHD for which he states he has been prescribed Adderall for several years.  Is the patient at risk to self? Yes.    Has the patient been a risk to self in the past 6 months? No.  Has the patient been a risk to self within the distant past? No.  Is the patient a risk to others? No.  Has the patient been a risk to others in the past 6 months? No.  Has the patient been a risk to others within the distant past? No.   Prior Inpatient Therapy:  denies  Prior Outpatient Therapy:  states Adderall is prescribed by PCP  Alcohol Screening: 1. How often do you have a drink containing alcohol?: Monthly or less (just started drinking this week) 2. How many drinks containing alcohol do you have on a typical day when you are drinking?: 5 or 6 3. How often do you have six or more drinks on one occasion?: Less than monthly AUDIT-C Score: 4 4. How often during the last year have you found that you were not able to stop drinking once you had started?: Never 5. How often during the last year have you failed to do what was normally expected from you because of drinking?: Never 6. How often during the last year have you needed a first drink in the morning to get yourself going after a heavy drinking session?: Never 7. How often during the last year have you had a feeling  of guilt of remorse after drinking?: Never 8. How often during the last year have you been unable to remember what happened the night before because you had been drinking?: Never 9. Have you or someone else been injured as a result of your drinking?: No 10. Has a relative or friend or a doctor or another health worker been concerned about  your drinking or suggested you cut down?: No Alcohol Use Disorder Identification Test Final Score (AUDIT): 4 Alcohol Brief Interventions/Follow-up: AUDIT Score <7 follow-up not indicated Substance Abuse History in the last 12 months:  Smokes cannabis regularly, often daily. Denies other drug abuse . Denies prior history of alcohol use disorder but states has been drinking daily, heavily over the last 1-2 weeks.  Reports long term treatment with Adderall for ADHD, which he denies abusing or misusing . Consequences of Substance Abuse: Denies history of seizures or DTs, no history of DUIs, history of alcohol blackout in the past  Previous Psychotropic Medications: Reports he was taking Adderall 20 mgrs TID. Reports he has been on this medication for years , for history of ADHD Psychological Evaluations: No Past Medical History: Denies medical illnesses . NKDA . Past Medical History:  Diagnosis Date  . Attention deficit   . Hypertension    History reviewed. No pertinent surgical history. Family History: parents alive, separated, has 2 brother and 1 brother. A sister passed away 2 months ago from drug overdose   Family History  Problem Relation Age of Onset  . Hypertension Mother   . COPD Father   . COPD Maternal Grandmother   . Hypertension Maternal Grandmother   . Heart disease Maternal Grandmother   . Hypertension Paternal Grandmother   . Cancer Paternal Grandmother    Family Psychiatric  History: reports brother has Bipolar Disorder. Brother has history of alcohol use disorder. *Sister passed away from overdose 2 months ago- patient reports he believes this was accidental  Tobacco Screening: vapes tobacco product  Social History: 19, single, no children, employed, lives with mother Social History   Substance and Sexual Activity  Alcohol Use Yes   Comment: drank 14 cans of beer yesterday; just started drinking a week ago     Social History   Substance and Sexual Activity  Drug Use  Yes  . Types: Marijuana   Comment: 2 oz a week    Additional Social History: Marital status: Long term relationship Long term relationship, how long?: 7 yrs What types of issues is patient dealing with in the relationship?: Pt reports recent break up with girlfriend, had been dating 7 yrs. Pt states his girlfriend moved to [place] and said "she wasnt happy anymore" Does patient have children?: No  Allergies:  No Known Allergies Lab Results:  Results for orders placed or performed during the hospital encounter of 09/29/19 (from the past 48 hour(s))  Hemoglobin A1c     Status: None   Collection Time: 09/30/19  6:29 AM  Result Value Ref Range   Hgb A1c MFr Bld 4.8 4.8 - 5.6 %    Comment: (NOTE) Pre diabetes:          5.7%-6.4%  Diabetes:              >6.4%  Glycemic control for   <7.0% adults with diabetes    Mean Plasma Glucose 91.06 mg/dL    Comment: Performed at [facility], [address] [place], [place] [address]  Lipid panel     Status: None   Collection  Time: 09/30/19  6:29 AM  Result Value Ref Range   Cholesterol 115 0 - 200 mg/dL   Triglycerides 63 <300 mg/dL   HDL 46 >92 mg/dL   Total CHOL/HDL Ratio 2.5 RATIO   VLDL 13 0 - 40 mg/dL   LDL Cholesterol 56 0 - 99 mg/dL    Comment:        Total Cholesterol/HDL:CHD Risk Coronary Heart Disease Risk Table                     Men   Women  1/2 Average Risk   3.4   3.3  Average Risk       5.0   4.4  2 X Average Risk   9.6   7.1  3 X Average Risk  23.4   11.0        Use the calculated Patient Ratio above and the CHD Risk Table to determine the patient's CHD Risk.        ATP III CLASSIFICATION (LDL):  <100     mg/dL   Optimal  330-076  mg/dL   Near or Above                    Optimal  130-159  mg/dL   Borderline  226-333  mg/dL   High  >545     mg/dL   Very High Performed at [facility], [address], [place], [place] [address]   TSH     Status: None   Collection Time: 09/30/19  6:29  AM  Result Value Ref Range   TSH 2.082 0.350 - 4.500 uIU/mL    Comment: Performed by a 3rd Generation assay with a functional sensitivity of <=0.01 uIU/mL. Performed at [facility], [address], [place], [place] [address]     Blood Alcohol level:  Lab Results  Component Value Date   ETH <10 09/28/2019    Metabolic Disorder Labs:  Lab Results  Component Value Date   HGBA1C 4.8 09/30/2019   MPG 91.06 09/30/2019   No results found for: PROLACTIN Lab Results  Component Value Date   CHOL 115 09/30/2019   TRIG 63 09/30/2019   HDL 46 09/30/2019   CHOLHDL 2.5 09/30/2019   VLDL 13 09/30/2019   LDLCALC 56 09/30/2019    Current Medications: Current Facility-Administered Medications  Medication Dose Route Frequency Provider Last Rate Last Admin  . acetaminophen (TYLENOL) tablet 650 mg  650 mg Oral Q6H PRN Jackelyn Poling, NP      . alum & mag hydroxide-simeth (MAALOX/MYLANTA) 200-200-20 MG/5ML suspension 30 mL  30 mL Oral Q4H PRN Nira Conn A, NP      . feeding supplement (ENSURE ENLIVE) (ENSURE ENLIVE) liquid 237 mL  237 mL Oral BID BM Nira Conn A, NP   237 mL at 09/30/19 1025  . hydrOXYzine (ATARAX/VISTARIL) tablet 25 mg  25 mg Oral TID PRN Nira Conn A, NP      . magnesium hydroxide (MILK OF MAGNESIA) suspension 30 mL  30 mL Oral Daily PRN Nira Conn A, NP      . traZODone (DESYREL) tablet 50 mg  50 mg Oral QHS PRN Jackelyn Poling, NP       PTA Medications: Medications Prior to Admission  Medication Sig Dispense Refill Last Dose  . amphetamine-dextroamphetamine (ADDERALL) 20 MG tablet Take 20 mg by mouth 3 (three) times daily.     . famotidine (PEPCID) 20 MG tablet Take  1 tablet (20 mg total) by mouth 2 (two) times daily. 20 tablet 0   . ibuprofen (ADVIL,MOTRIN) 600 MG tablet Take 1 tablet (600 mg total) by mouth every 6 (six) hours as needed. 30 tablet 0     Musculoskeletal: Strength & Muscle Tone: within normal limits no tremors , no  diaphoresis, no restlessness or psychomotor agitation Gait & Station: normal Patient leans: N/A  Psychiatric Specialty Exam: Physical Exam  Review of Systems  Constitutional: Negative.   HENT: Negative.   Eyes: Negative.   Respiratory: Negative.  Negative for cough and shortness of breath.   Cardiovascular: Negative.  Negative for chest pain.  Gastrointestinal: Positive for nausea. Negative for vomiting.  Endocrine: Negative.   Genitourinary: Negative.   Musculoskeletal: Negative.   Skin: Negative for rash.  Allergic/Immunologic: Negative.   Neurological: Positive for headaches. Negative for seizures.  Psychiatric/Behavioral: Positive for suicidal ideas.    Blood pressure 117/88, pulse 80, temperature 98.5 F (36.9 C), temperature source Oral, resp. rate 18, height 6\' 2"  (1.88 m), weight 83.6 kg.Body mass index is 23.66 kg/m.  General Appearance: Fairly Groomed  Eye Contact:  Fair  Speech:  Normal Rate  Volume:  Normal  Mood:  Depressed  Affect:  constricted   Thought Process:  Linear and Descriptions of Associations: Intact  Orientation:  Other:  fully alert and attentive  Thought Content:  no hallucinations, no delusions, not internally preoccupied   Suicidal Thoughts:  No denies current suicidal or self injurious ideations, denies homicidal or violent ideations, specifically also denies violent or HI towards ex GF  Homicidal Thoughts:  No  Memory:  recent and remote grossly intact   Judgement:  Fair  Insight:  Fair  Psychomotor Activity:  Normal- currently no tremors or diaphoresis, no restlessness or agitation  Concentration:  Concentration: Good and Attention Span: Good  Recall:  Good  Fund of Knowledge:  Good  Language:  Good  Akathisia:  Negative  Handed:  Right  AIMS (if indicated):     Assets:  Communication Skills Desire for Improvement Resilience  ADL's:  Intact  Cognition:  WNL  Sleep:  Number of Hours: 6.5    Treatment Plan Summary: Daily contact  with patient to assess and evaluate symptoms and progress in treatment, Medication management, Plan inpatient treatment  and medications as below  Observation Level/Precautions:  15 minute checks  Laboratory:  Recheck BMP in AM to monitor K+ ( hypokalemic ( 3.2)  in ED , reports he was given K+ supplement at the time)   Psychotherapy:  Milieu, group therapy  Medications:  We discussed treatment options. Agrees to antidepressant trial. Options reviewed. Agrees to SSRI trial. Will start CELEXA 10 mgrs QDAY initially. States he had not been taking Adderall for several days prior to admission- will not restart at this time  ATIVAN PRN for alcohol WDL as needed , per CIWA protocol   Consultations:  As needed  Discharge Concerns:  -  Estimated LOS: 3-4 days   Other:     Physician Treatment Plan for Primary Diagnosis:  MDD. Also consider GAD Long Term Goal(s): Improvement in symptoms so as ready for discharge  Short Term Goals: Ability to identify changes in lifestyle to reduce recurrence of condition will improve, Ability to verbalize feelings will improve, Ability to disclose and discuss suicidal ideas, Ability to demonstrate self-control will improve, Ability to identify and develop effective coping behaviors will improve, Ability to maintain clinical measurements within normal limits will improve and Compliance with prescribed  medications will improve  Physician Treatment Plan for Secondary Diagnosis: Active Problems:   Severe major depression, single episode, without psychotic features (HCC)  Long Term Goal(s): Improvement in symptoms so as ready for discharge  Short Term Goals: Ability to identify changes in lifestyle to reduce recurrence of condition will improve, Ability to verbalize feelings will improve, Ability to disclose and discuss suicidal ideas, Ability to demonstrate self-control will improve, Ability to identify and develop effective coping behaviors will improve, Ability to  maintain clinical measurements within normal limits will improve, Compliance with prescribed medications will improve and Ability to identify triggers associated with substance abuse/mental health issues will improve  I certify that inpatient services furnished can reasonably be expected to improve the patient's condition.    Craige Cotta, MD 7/20/202111:59 AM

## 2019-10-01 LAB — BASIC METABOLIC PANEL
Anion gap: 9 (ref 5–15)
BUN: 12 mg/dL (ref 6–20)
CO2: 28 mmol/L (ref 22–32)
Calcium: 9.8 mg/dL (ref 8.9–10.3)
Chloride: 101 mmol/L (ref 98–111)
Creatinine, Ser: 0.93 mg/dL (ref 0.61–1.24)
GFR calc Af Amer: >60 mL/min (ref >60)
GFR calc non Af Amer: >60 mL/min (ref >60)
Glucose, Bld: 89 mg/dL (ref 70–99)
Potassium: 4.3 mmol/L (ref 3.5–5.1)
Sodium: 138 mmol/L (ref 135–145)

## 2019-10-01 MED ORDER — HYDROXYZINE HCL 25 MG PO TABS: 25.0000 mg | ORAL_TABLET | Freq: Four times a day (QID) | ORAL | 0 refills | Status: AC | PRN

## 2019-10-01 MED ORDER — CITALOPRAM HYDROBROMIDE 10 MG PO TABS: 10.0000 mg | ORAL_TABLET | Freq: Every day | ORAL | 0 refills | Status: AC

## 2019-10-01 MED ORDER — TRAZODONE HCL 50 MG PO TABS: 50.0000 mg | ORAL_TABLET | Freq: Every evening | ORAL | 0 refills | Status: AC | PRN

## 2019-10-01 NOTE — Progress Notes (Signed)
   09/30/19 2055  COVID-19 Daily Checkoff  Have you had a fever (temp > 37.80C/100F)  in the past 24 hours?  No  COVID-19 EXPOSURE  Have you traveled outside the state in the past 14 days? No  Have you been in contact with someone with a confirmed diagnosis of COVID-19 or PUI in the past 14 days without wearing appropriate PPE? No  Have you been living in the same home as a person with confirmed diagnosis of COVID-19 or a PUI (household contact)? No  Have you been diagnosed with COVID-19? No

## 2019-10-01 NOTE — [facility] Suicide Risk Assessment (Signed)
[facility] INPATIENT:  Family/Significant Other Suicide Prevention Education  Suicide Prevention Education:  Education Completed; Benjamin Bryant, ex-girlfriend [phone] has been identified by the patient as the family member/significant other with whom the patient will be residing, and identified as the person(s) who will aid the patient in the event of a mental health crisis (suicidal ideations/suicide attempt).  With written consent from the patient, the family member/significant other has been provided the following suicide prevention education, prior to the and/or following the discharge of the patient.  The suicide prevention education provided includes the following:  Suicide risk factors  Suicide prevention and interventions  National Suicide Hotline telephone number  [facility] assessment telephone number  [facility] Emergency Assistance 911  [facility] and/or Residential Mobile Crisis Unit telephone number  Request made of family/significant other to:  Remove weapons (e.g., guns, rifles, knives), all items previously/currently identified as safety concern.    Remove drugs/medications (over-the-counter, prescriptions, illicit drugs), all items previously/currently identified as a safety concern.  The family member/significant other verbalizes understanding of the suicide prevention education information provided.  The family member/significant other agrees to remove the items of safety concern listed above. Ms. Benjamin Bryant reports they dated for 6 yrs and during that time reports "constant arguing." She reports they broke up 6 months ago and she moved to [place]. Ms. Benjamin Bryant raised concern about the pt having previously come to her job unannounced and texting and calling her repeatedly. Prior to his most recent hospitalizations, she says she had not spoken with the pt. Ms. Benjamin Bryant discussed receiving reports from pt friends about the pt drinking alcohol heavily, which she  states is uncharacteristic of him. She denies the pt having any prior history of alcohol use. She reports the pt used to own a firearm "but got rid of it" She states the pt lives with his mother.  Benjamin Bryant Benjamin Bryant 10/01/2019, 11:38 AM

## 2019-10-01 NOTE — [facility] Counselor (Signed)
Pt has been discharged. 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 Physician had not confirmed whether or not pt would be discharged. CSW was not made aware of the pt being discharged.

## 2019-10-01 NOTE — Plan of Care (Signed)
Discharge note  Patient verbalizes readiness for discharge. Follow up plan explained, AVS, Transition record and SRA given. Prescriptions and teaching provided. Belongings returned and signed for. Suicide safety plan completed and signed. Patient verbalizes understanding. Patient denies SI/HI and assures this Clinical research associate they will seek assistance should that change. Patient discharged to lobby where mother was waiting.  Problem: Education: Goal: Knowledge of [facility] Education information/materials will improve Outcome: Adequate for Discharge Goal: Emotional status will improve Outcome: Adequate for Discharge Goal: Mental status will improve Outcome: Adequate for Discharge Goal: Verbalization of understanding the information provided will improve Outcome: Adequate for Discharge   Problem: Activity: Goal: Interest or engagement in activities will improve Outcome: Adequate for Discharge Goal: Sleeping patterns will improve Outcome: Adequate for Discharge   Problem: Coping: Goal: Ability to verbalize frustrations and anger appropriately will improve Outcome: Adequate for Discharge Goal: Ability to demonstrate self-control will improve Outcome: Adequate for Discharge   Problem: Health Behavior/Discharge Planning: Goal: Identification of resources available to assist in meeting health care needs will improve Outcome: Adequate for Discharge Goal: Compliance with treatment plan for underlying cause of condition will improve Outcome: Adequate for Discharge   Problem: Physical Regulation: Goal: Ability to maintain clinical measurements within normal limits will improve Outcome: Adequate for Discharge   Problem: Safety: Goal: Periods of time without injury will increase Outcome: Adequate for Discharge   Problem: Education: Goal: Ability to make informed decisions regarding treatment will improve Outcome: Adequate for Discharge   Problem: Coping: Goal: Coping ability will  improve Outcome: Adequate for Discharge   Problem: Health Behavior/Discharge Planning: Goal: Identification of resources available to assist in meeting health care needs will improve Outcome: Adequate for Discharge   Problem: Medication: Goal: Compliance with prescribed medication regimen will improve Outcome: Adequate for Discharge   Problem: Self-Concept: Goal: Ability to disclose and discuss suicidal ideas will improve Outcome: Adequate for Discharge Goal: Will verbalize positive feelings about self Outcome: Adequate for Discharge

## 2019-10-01 NOTE — Progress Notes (Signed)
   09/30/19 2055  Psych Admission Type (Psych Patients Only)  Admission Status Voluntary  Psychosocial Assessment  Patient Complaints Anxiety;Depression;Sadness;Worrying  Eye Contact Fair  Facial Expression Sad  Affect Anxious;Depressed;Sad  Speech Logical/coherent  Interaction Assertive  Motor Activity Other (Comment) (WNL)  Appearance/Hygiene Unremarkable  Behavior Characteristics Anxious  Mood Depressed;Anxious;Sad  Thought Process  Coherency WDL  Content WDL  Delusions None reported or observed  Perception WDL  Hallucination None reported or observed  Judgment Poor  Confusion None  Danger to Self  Current suicidal ideation? Denies  Self-Injurious Behavior No self-injurious ideation or behavior indicators observed or expressed   Agreement Not to Harm Self Yes  Description of Agreement verbally contracts for safety  Danger to Others  Danger to Others None reported or observed

## 2019-10-01 NOTE — [facility] Suicide Risk Assessment (Signed)
[facility] Discharge Suicide Risk Assessment   Principal Problem: Severe major depression, single episode, without psychotic features [facility]) Discharge Diagnoses: Principal Problem:   Severe major depression, single episode, without psychotic features (HCC)   Total Time spent with patient: 30 minutes  Musculoskeletal: Strength & Muscle Tone: within normal limits no tremors, no diaphoresis, no restlessness or agitation Gait & Station: normal Patient leans: N/A  Psychiatric Specialty Exam: Review of Systems no headache, no visual disturbances, no chest pain, no nausea or vomiting   Blood pressure 116/81, pulse 62, temperature 98.3 F (36.8 C), temperature source Oral, resp. rate 18, height 6\' 2"  (1.88 m), weight 83.6 kg.Body mass index is 23.66 kg/m.  General Appearance: Casual  Eye Contact::  Good  Speech:  Normal Rate409  Volume:  Normal  Mood:  reports mood is "  a lot better today"  Affect:  more reactive, fuller in range  Thought Process:  Linear and Descriptions of Associations: Intact  Orientation:  Full (Time, Place, and Person)  Thought Content:  no hallucinations, no delusions, not internally preoccupied   Suicidal Thoughts:  No denies suicidal or self injurious ideations, denies homicidal or violent ideations towards anyone   Homicidal Thoughts:  No  Memory:  recent and remote grossly intact   Judgement:  Other:  improving   Insight:  fair  Psychomotor Activity:  Normal presents calm and without current symptoms of WDL   Concentration:  Good  Recall:  Good  Fund of Knowledge:Good  Language: Good  Akathisia:  Negative  Handed:  Right  AIMS (if indicated):     Assets:  Communication Skills Desire for Improvement Resilience  Sleep:  Number of Hours: 6.75  Cognition: WNL  ADL's:  Intact   Mental Status Per Nursing Assessment::   On Admission:  Suicidal ideation indicated by patient  Demographic Factors:  19 year old male, lives with mother, employed   Loss  Factors: Break up with GF. Increased pattern of alcohol consumption, sister passed away recently  Historical Factors: No prior psychiatric admissions, no past suicide attempts, recently increased pattern of alcohol use  Risk Reduction Factors:   Employed, Living with another person, especially a relative and Positive coping skills or problem solving skills  Continued Clinical Symptoms:  Currently patient presents alert, attentive, calm, reports mood is better, affect is more reactive, no thought disorder, denies suicidal or self injurious ideations, denies homicidal or violent ideations, denies hallucinations, no delusions, not internally preoccupied, future oriented. He is not presenting with symptoms of alcohol WDL at this time- no tremors, no diaphoresis, no restlessness or psychomotor agitation. Vitals are stable ( 116/81, pulse 62) . Last alochol consumption was 3-4 days ago. He expresses concern that he is going to lose his job if he does not report to work tomorrow, and states he cannot afford to lose his job as he is dependent on this income . Denies medication side effects Behavior on unit calm and in good control. No disruptive or agitated behaviors. With his express consent I spoke with his mother , with whom he lives, and who confirms that patient appears improved, stabilized , and is in agreement with discharge- she will pick him up later today Labs reviewed- K+ normalized at 4.3    Cognitive Features That Contribute To Risk:  No gross cognitive deficits noted upon discharge. Is alert , attentive, and oriented x 3    Suicide Risk:  Mild:  Suicidal ideation of limited frequency, intensity, duration, and specificity.  There are no  identifiable plans, no associated intent, mild dysphoria and related symptoms, good self-control (both objective and subjective assessment), few other risk factors, and identifiable protective factors, including available and accessible social support.    Follow-up Information    [facility], [facility] Follow up.   Contact information: [address] [place] [address] [phone]               Plan Of Care/Follow-up recommendations:  Activity:  as tolerated  Diet:  regular Tests:  NA Other:  See below  Patient is requesting discharge and there are no current grounds for involuntary commitment at present He is planning on returning home Plans to follow up as above   Craige Cotta, MD 10/01/2019, 12:23 PM

## 2019-10-01 NOTE — Discharge Summary (Addendum)
Physician Discharge Summary Note  Patient:  Benjamin Bryant is an 19 y.o., male MRN:  [id number] DOB:  [birth year] Patient phone:  1970 (home)  Patient address:   [address] [place] [address],   Total Time spent with patient: Greater than 30 minutes  Date of Admission:  09/29/2019 Date of Discharge: 10-01-19  Reason for Admission: Expression of suicidal ideations triggered by relationship break-up.  Principal Problem: Severe major depression, single episode, without psychotic features [facility])  Discharge Diagnoses: Principal Problem:   Severe major depression, single episode, without psychotic features [facility])  Past Psychiatric History: Major depressive disorder, single episode.  Past Medical History:  Past Medical History:  Diagnosis Date  . Attention deficit   . Hypertension    History reviewed. No pertinent surgical history. Family History:  Family History  Problem Relation Age of Onset  . Hypertension Mother   . COPD Father   . COPD Maternal Grandmother   . Hypertension Maternal Grandmother   . Heart disease Maternal Grandmother   . Hypertension Paternal Grandmother   . Cancer Paternal Grandmother    Family Psychiatric  History: See H&P  Social History:  Social History   Substance and Sexual Activity  Alcohol Use Yes   Comment: drank 14 cans of beer yesterday; just started drinking a week ago     Social History   Substance and Sexual Activity  Drug Use Yes  . Types: Marijuana   Comment: 2 oz a week    Social History   Socioeconomic History  . Marital status: Single    Spouse name: Not on file  . Number of children: Not on file  . Years of education: Not on file  . Highest education level: Not on file  Occupational History  . Not on file  Tobacco Use  . Smoking status: Never Smoker  . Smokeless tobacco: Former Clinical biochemist  . Vaping Use: Every day  . Substances: Nicotine  Substance and Sexual Activity  . Alcohol  use: Yes    Comment: drank 14 cans of beer yesterday; just started drinking a week ago  . Drug use: Yes    Types: Marijuana    Comment: 2 oz a week  . Sexual activity: Yes  Other Topics Concern  . Not on file  Social History Narrative   Pt is a 19 year old male who is employed currently at [employer]. His mother lives with him as well as his deceased sister's 3 children. Pt sister died of an overdose a few months ago. Pt also broke up with his girlfriend of 7 years about 7 months ago.    Social Determinants of Health   Financial Resource Strain:   . Difficulty of Paying Living Expenses:   Food Insecurity:   . Worried About Programme researcher, broadcasting/film/video in the Last Year:   . Barista in the Last Year:   Transportation Needs:   . Freight forwarder (Medical):   Marland Kitchen Lack of Transportation (Non-Medical):   Physical Activity:   . Days of Exercise per Week:   . Minutes of Exercise per Session:   Stress:   . Feeling of Stress :   Social Connections:   . Frequency of Communication with Friends and Family:   . Frequency of Social Gatherings with Friends and Family:   . Attends Religious Services:   . Active Member of Clubs or Organizations:   . Attends Banker Meetings:   .  Marital Status:    Hospital Course: (Per Md's admission evaluation notes): 19 year old male, presented to ED on 09/28/19 (voluntarily, at encouragement of friends), after expressing suicidal ideations . Patient reports he has been experiencing suicidal ideations recently, which he describes as passive ( states " I didn't care whatever happened"). States he has been feeling depressed over the last 6-7 months following break up with GF, whom he had been dating for several years. States they continued amicable relationship after break up but states " she stopped talking to me a few weeks ago". He also reports that one of his sisters passed away a few weeks ago from an accidental drug overdose. He denies prior  history of alcohol use disorder but states that over the last week he has been drinking up to 24 beers per day. (admission BAL negative, UDS positive for cannabis). Endorses neuro-vegetative symptoms of depression as below. Denies psychotic symptoms.  After the above admission evaluation, Priscilla's presenting symptoms were noted. He was recommended for mood stabilization treatments. The medication regimen targeting those presenting symptoms were discussed with him & initiated with his consent. He was medicated, stabilized & discharged on the medications as listed on his discharge medication lists below. Besides the mood stabilization treatments, Verdon CumminsJesse was also enrolled & participated in the group counseling sessions being offered & held on this unit. He learned coping skills. He presented no other significant pre-existing medical issues that required treatment. He tolerated his treatment regimen without any adverse effects or reactions reported.  During the follow-up care assessment this morning, Verdon CumminsJesse asked to be discharged to his home with his mother. He says he has to be at work in the morning or he will lose the job. He says emotionally he is feeling great & yet worried about losing his job which will not do him any good because he was out of work for over 3 months prior to getting this particular job 3 weeks ago. He says he has tried to call his boss (work) to tell them he is in the hospital, but was told he has to be at work tomorrow or lose the the job. Patient expressed that he also asked his mother to call his work, she did & received the answer. A collateral information from patient's mother Candy via the telephone confirmed what patient has been expressing. Verdon CumminsJesse says from here onwards he would be more open to his mother about how he is feeling to avoid himself sinking back into this kind of depression again. He says he is feeling great & the medication he started here should be able to help him as  well.  During the course of his brief psychiatric hospitalization, the 15-minute checks were adequate to ensure Phyllis's safety.  Patient did not display any dangerous violent or suicidal behavior on the unit.  He interacted with patients & staff appropriately. He participated appropriately in the group sessions/therapies. His medications were addressed & adjusted to meet his needs. He was recommended for outpatient follow-up care & medication management upon discharge to assure continuity of care & mood stability. At the time of discharge patient is not reporting any acute suicidal/homicidal ideations. He feels more confident about his self-care & in managing the suicidal ideations if it ever happens again. He currently denies any new issues or concerns. Education and supportive counseling provided throughout his hospital stay & upon discharge.  Today upon his discharge evaluation with the attending psychiatrist, Brayton CavesJessie shares he is doing well. He denies any  other specific concerns. He is sleeping well. His appetite is good. He denies other physical complaints. He denies AH/VH. he feels that his medications have been helpful & is in agreement to continue his current treatment regimen. He was able to engage in safety planning including plan to return to [facility] or contact emergency services if he feels unable to maintain his own safety or the safety of others. Pt had no further questions, comments, or concerns. He left [facility] with all personal belongings in no apparent distress. Transportation per his mother.  Physical Findings: AIMS:  , ,  ,  ,    CIWA:  CIWA-Ar Total: 1 COWS:     Musculoskeletal: Strength & Muscle Tone: within normal limits Gait & Station: normal Patient leans: N/A  Psychiatric Specialty Exam: Physical Exam Vitals and nursing note reviewed.  HENT:     Head: Normocephalic.     Nose: Nose normal.     Mouth/Throat:     Pharynx: Oropharynx is clear.  Eyes:     Pupils: Pupils are  equal, round, and reactive to light.  Cardiovascular:     Rate and Rhythm: Normal rate.     Pulses: Normal pulses.  Pulmonary:     Effort: Pulmonary effort is normal.  Genitourinary:    Comments: Deferred Musculoskeletal:        General: Normal range of motion.     Cervical back: Normal range of motion.  Skin:    General: Skin is warm and dry.  Neurological:     Mental Status: He is alert and oriented to person, place, and time.     Review of Systems  Constitutional: Negative for chills, diaphoresis and fever.  HENT: Negative for congestion, rhinorrhea, sneezing and sore throat.   Eyes: Negative for discharge.  Respiratory: Negative for cough, chest tightness, shortness of breath and wheezing.   Cardiovascular: Negative for chest pain and palpitations.  Gastrointestinal: Negative for diarrhea, nausea and vomiting.  Endocrine: Negative for cold intolerance.  Genitourinary: Negative for difficulty urinating.  Musculoskeletal: Negative for arthralgias and myalgias.  Allergic/Immunologic: Negative for environmental allergies and food allergies.       Allergies: NKDA  Neurological: Negative for dizziness, tremors, seizures, syncope, light-headedness and headaches.  Psychiatric/Behavioral: Positive for dysphoric mood (Stabilized with medication prior to discharge) and sleep disturbance (Stabilized with medication prior to discharge). Negative for agitation, behavioral problems, confusion, decreased concentration, hallucinations, self-injury and suicidal ideas. The patient is not nervous/anxious (Stable upon discharge) and is not hyperactive.     Blood pressure 116/81, pulse 62, temperature 98.3 F (36.8 C), temperature source Oral, resp. rate 18, height 6\' 2"  (1.88 m), weight 83.6 kg.Body mass index is 23.66 kg/m.  See Md's admission SRA  Sleep:  Number of Hours: 6.75   Have you used any form of tobacco in the last 30 days? (Cigarettes, Smokeless Tobacco, Cigars, and/or Pipes): Yes   Has this patient used any form of tobacco in the last 30 days? (Cigarettes, Smokeless Tobacco, Cigars, and/or Pipes): N/A  Blood Alcohol level:  Lab Results  Component Value Date   ETH <10 09/28/2019   Metabolic Disorder Labs:  Lab Results  Component Value Date   HGBA1C 4.8 09/30/2019   MPG 91.06 09/30/2019   No results found for: PROLACTIN Lab Results  Component Value Date   CHOL 115 09/30/2019   TRIG 63 09/30/2019   HDL 46 09/30/2019   CHOLHDL 2.5 09/30/2019   VLDL 13 09/30/2019   LDLCALC 56 09/30/2019   See Psychiatric  Specialty Exam and Suicide Risk Assessment completed by Attending Physician prior to discharge.  Discharge destination:  Home  Is patient on multiple antipsychotic therapies at discharge:  No   Has Patient had three or more failed trials of antipsychotic monotherapy by history:  No  Recommended Plan for Multiple Antipsychotic Therapies: NA  Allergies as of 10/01/2019   No Known Allergies     Medication List    STOP taking these medications   amphetamine-dextroamphetamine 20 MG tablet Commonly known as: ADDERALL   famotidine 20 MG tablet Commonly known as: PEPCID   ibuprofen 600 MG tablet Commonly known as: ADVIL     TAKE these medications     Indication  citalopram 10 MG tablet Commonly known as: CELEXA Take 1 tablet (10 mg total) by mouth daily. For derpression Start taking on: October 02, 2019  Indication: Depression, Generalized Anxiety Disorder   hydrOXYzine 25 MG tablet Commonly known as: ATARAX/VISTARIL Take 1 tablet (25 mg total) by mouth every 6 (six) hours as needed (anxiety).  Indication: Feeling Anxious   traZODone 50 MG tablet Commonly known as: DESYREL Take 1 tablet (50 mg total) by mouth at bedtime as needed for sleep.  Indication: Trouble Sleeping       Follow-up Information    Services, [facility] Follow up.   Contact information: [address] [place] [address] [phone]               Follow-up recommendations: Activity:  As tolerated Diet: As recommended by your primary care doctor. Keep all scheduled follow-up appointments as recommended.  Comments: Prescriptions given at discharge.  Patient agreeable to plan.  Given opportunity to ask questions.  Appears to feel comfortable with discharge denies any current suicidal or homicidal thought. Patient is also instructed prior to discharge to: Take all medications as prescribed by his/her mental healthcare provider. Report any adverse effects and or reactions from the medicines to his/her outpatient provider promptly. Patient has been instructed & cautioned: To not engage in alcohol and or illegal drug use while on prescription medicines. In the event of worsening symptoms, patient is instructed to call the crisis hotline, 911 and or go to the nearest ED for appropriate evaluation and treatment of symptoms. To follow-up with his/her primary care provider for your other medical issues, concerns and or health care needs.  Signed: Armandina Stammer, NP, PMHNP, FNP-BC 10/01/2019, 11:24 AM   Patient seen, Suicide Assessment Completed.  Disposition Plan Reviewed

## 2019-11-10 ENCOUNTER — Encounter (HOSPITAL_COMMUNITY): Payer: Self-pay

## 2019-11-10 ENCOUNTER — Emergency Department (HOSPITAL_COMMUNITY)
Admission: EM | Admit: 2019-11-10 | Discharge: 2019-11-10 | Discharge status: Against Medical Advice | Payer: Medicaid Other | Attending: Emergency Medicine | Admitting: Emergency Medicine

## 2019-11-10 ENCOUNTER — Emergency Department (HOSPITAL_COMMUNITY): Payer: Medicaid Other

## 2019-11-10 ENCOUNTER — Other Ambulatory Visit: Payer: Self-pay

## 2019-11-10 DIAGNOSIS — R112 Nausea with vomiting, unspecified: Secondary | ICD-10-CM | POA: Diagnosis not present

## 2019-11-10 DIAGNOSIS — Z20822 Contact with and (suspected) exposure to covid-19: Secondary | ICD-10-CM | POA: Insufficient documentation

## 2019-11-10 DIAGNOSIS — I10 Essential (primary) hypertension: Secondary | ICD-10-CM | POA: Insufficient documentation

## 2019-11-10 DIAGNOSIS — R042 Hemoptysis: Secondary | ICD-10-CM | POA: Insufficient documentation

## 2019-11-10 DIAGNOSIS — F909 Attention-deficit hyperactivity disorder, unspecified type: Secondary | ICD-10-CM | POA: Diagnosis not present

## 2019-11-10 DIAGNOSIS — R079 Chest pain, unspecified: Secondary | ICD-10-CM | POA: Diagnosis not present

## 2019-11-10 LAB — SARS CORONAVIRUS 2 BY RT PCR (HOSPITAL ORDER, PERFORMED IN ~~LOC~~ HOSPITAL LAB): SARS Coronavirus 2: NEGATIVE

## 2019-11-10 NOTE — ED Provider Notes (Signed)
Emergency Department Provider Note   I have reviewed the triage vital signs and the nursing notes.   HISTORY  Chief Complaint Cough and Chest Pain   HPI Benjamin Bryant is a 19 y.o. male with past medical history reviewed below presents to the emergency department for 1 week of productive cough with streaks of blood in the sputum.  The blood is bright red and small in volume.  He is experiencing some chest pressure which is worse with coughing and mild epigastric abdominal discomfort.  He did have one episode of vomiting but no diarrhea.  No sick contacts.  Denies severe shortness of breath.  No prior history of DVT/PE.  No recent surgery.  No recent travel with prolonged sitting or inactivity.  No radiation of symptoms or other modifying factors.  Past Medical History:  Diagnosis Date  . Attention deficit   . Hypertension     Patient Active Problem List   Diagnosis Date Noted  . Severe major depression, single episode, without psychotic features (HCC) 09/29/2019  . Attention deficit hyperactivity disorder (ADHD) 07/16/2015    History reviewed. No pertinent surgical history.  Allergies Patient has no known allergies.  Family History  Problem Relation Age of Onset  . Hypertension Mother   . COPD Father   . COPD Maternal Grandmother   . Hypertension Maternal Grandmother   . Heart disease Maternal Grandmother   . Hypertension Paternal Grandmother   . Cancer Paternal Grandmother     Social History Social History   Tobacco Use  . Smoking status: Never Smoker  . Smokeless tobacco: Former Clinical biochemist  . Vaping Use: Every day  . Substances: Nicotine  Substance Use Topics  . Alcohol use: Not Currently  . Drug use: Not Currently    Types: Marijuana    Review of Systems  Constitutional: No fever/chills Eyes: No visual changes. ENT: No sore throat. Cardiovascular: Positive chest pain. Respiratory: Denies shortness of breath. Positive cough with streaky  hemoptysis.  Gastrointestinal: No abdominal pain. Positive nausea and vomiting x 1 today.  No diarrhea.  No constipation. Genitourinary: Negative for dysuria. Musculoskeletal: Negative for back pain. Skin: Negative for rash. Neurological: Negative for headaches, focal weakness or numbness.  10-point ROS otherwise negative.  ____________________________________________   PHYSICAL EXAM:  VITAL SIGNS: ED Triage Vitals  Enc Vitals Group     BP 11/10/19 0847 137/76     Pulse Rate 11/10/19 0847 86     Resp 11/10/19 0847 18     Temp 11/10/19 0847 98.6 F (37 C)     Temp Source 11/10/19 0847 Oral     SpO2 11/10/19 0847 100 %     Weight 11/10/19 0844 190 lb (86.2 kg)     Height 11/10/19 0844 6\' 2"  (1.88 m)    Constitutional: Alert and oriented. Well appearing and in no acute distress. Eyes: Conjunctivae are normal.  Head: Atraumatic. Nose: No congestion/rhinnorhea. Mouth/Throat: Mucous membranes are moist.   Neck: No stridor.   Cardiovascular: Normal rate, regular rhythm. Good peripheral circulation. Grossly normal heart sounds.   Respiratory: Normal respiratory effort.  No retractions. Lungs CTAB. Gastrointestinal: Soft and nontender. No distention.  Musculoskeletal: No gross deformities of extremities. Neurologic:  Normal speech and language.  Skin:  Skin is warm, dry and intact. No rash noted.   ____________________________________________   LABS (all labs ordered are listed, but only abnormal results are displayed)  Labs Reviewed  SARS CORONAVIRUS 2 BY RT PCR [facility] ORDER, PERFORMED IN  [facility])   ____________________________________________  EKG   EKG Interpretation  Date/Time:  Monday November 10 2019 08:46:35 EDT Ventricular Rate:  87 PR Interval:  152 QRS Duration: 96 QT Interval:  346 QTC Calculation: 416 R Axis:   103 Text Interpretation: Normal sinus rhythm with sinus arrhythmia Rightward axis Borderline ECG No STEMI Confirmed by Alona Bene [phone]) on 11/10/2019 8:58:04 AM       ____________________________________________  RADIOLOGY  None  ____________________________________________   PROCEDURES  Procedure(s) performed:   Procedures  None  ____________________________________________   INITIAL IMPRESSION / ASSESSMENT AND PLAN / ED COURSE  Pertinent labs & imaging results that were available during my care of the patient were reviewed by me and considered in my medical decision making (see chart for details).   Patient presents to the emergency department for evaluation of chest pain with small volume hemoptysis.  Labs pending.  Vital signs are within normal limits.  Chest x-ray reviewed showing no acute findings.  EKG with no acute ischemic change.  Plan for Covid testing along with labs.  Have added on hepatic function and lipase given 1 episode of vomiting this morning.  We will also add on D-dimer with concern for possible PE.   Patient eloped from the emergency department after my initial evaluation.  No blood work or chest x-ray were performed prior to the patient leaving.  He did not alert staff or myself that he intended to leave.  ____________________________________________  FINAL CLINICAL IMPRESSION(S) / ED DIAGNOSES  Final diagnoses:  Hemoptysis    Note:  This document was prepared using Dragon voice recognition software and may include unintentional dictation errors.  Alona Bene, MD, [facility] Emergency Medicine    Toria Monte, Arlyss Repress, MD 11/11/19 [phone]

## 2019-11-10 NOTE — ED Notes (Signed)
Patient not in room RN has checked room twice for patient.

## 2019-11-10 NOTE — ED Triage Notes (Signed)
Pt reports cough x 1 week and has noticed some streaks of blood in what he is coughing up for the past 3 days.  C/O chest pain that is worse with coughing and abd pain.  Reports vomited once today.  Denies diarrhea.

## 2019-11-28 ENCOUNTER — Other Ambulatory Visit: Payer: Self-pay

## 2019-11-28 ENCOUNTER — Other Ambulatory Visit: Payer: Medicaid Other

## 2019-11-28 DIAGNOSIS — Z20822 Contact with and (suspected) exposure to covid-19: Secondary | ICD-10-CM

## 2019-12-01 LAB — NOVEL CORONAVIRUS, NAA: SARS-CoV-2, NAA: NOT DETECTED

## 2019-12-09 ENCOUNTER — Other Ambulatory Visit: Payer: Self-pay | Admitting: Critical Care Medicine

## 2019-12-09 ENCOUNTER — Other Ambulatory Visit: Payer: Medicaid Other

## 2019-12-09 DIAGNOSIS — Z20822 Contact with and (suspected) exposure to covid-19: Secondary | ICD-10-CM

## 2019-12-10 LAB — NOVEL CORONAVIRUS, NAA: SARS-CoV-2, NAA: NOT DETECTED

## 2019-12-10 LAB — SARS-COV-2, NAA 2 DAY TAT

## 2019-12-15 ENCOUNTER — Other Ambulatory Visit: Payer: Self-pay

## 2019-12-15 ENCOUNTER — Other Ambulatory Visit: Payer: Medicaid Other

## 2019-12-15 ENCOUNTER — Emergency Department (HOSPITAL_COMMUNITY)
Admission: EM | Admit: 2019-12-15 | Discharge: 2019-12-15 | Disposition: A | Discharge status: Home / Self Care | Payer: Medicaid Other

## 2019-12-15 DIAGNOSIS — Z20822 Contact with and (suspected) exposure to covid-19: Secondary | ICD-10-CM

## 2019-12-16 LAB — NOVEL CORONAVIRUS, NAA: SARS-CoV-2, NAA: DETECTED — AB

## 2019-12-16 LAB — SARS-COV-2, NAA 2 DAY TAT

## 2019-12-17 ENCOUNTER — Other Ambulatory Visit: Payer: Self-pay | Admitting: Internal Medicine

## 2019-12-17 DIAGNOSIS — U071 COVID-19: Secondary | ICD-10-CM

## 2019-12-17 DIAGNOSIS — Z720 Tobacco use: Secondary | ICD-10-CM

## 2019-12-17 NOTE — Progress Notes (Signed)
I connected by phone with Benjamin Bryant on 12/17/2019 at 1:28 PM to discuss the potential use of a new treatment for mild to moderate COVID-19 viral infection in non-hospitalized patients.  This patient is a 19 y.o. male that meets the FDA criteria for Emergency Use Authorization of COVID monoclonal antibody casirivimab/imdevimab or bamlanivimab/eteseviamb.  Has a (+) direct SARS-CoV-2 viral test result  Has mild or moderate COVID-19   Is NOT hospitalized due to COVID-19  Is within 10 days of symptom onset  Has at least one of the high risk factor(s) for progression to severe COVID-19 and/or hospitalization as defined in EUA.  Specific high risk criteria : Other high risk medical condition per CDC:  tobacco abuse   I have spoken and communicated the following to the patient or parent/caregiver regarding COVID monoclonal antibody treatment:  1. FDA has authorized the emergency use for the treatment of mild to moderate COVID-19 in adults and pediatric patients with positive results of direct SARS-CoV-2 viral testing who are 38 years of age and older weighing at least 40 kg, and who are at high risk for progressing to severe COVID-19 and/or hospitalization.  2. The significant known and potential risks and benefits of COVID monoclonal antibody, and the extent to which such potential risks and benefits are unknown.  3. Information on available alternative treatments and the risks and benefits of those alternatives, including clinical trials.  4. Patients treated with COVID monoclonal antibody should continue to self-isolate and use infection control measures (e.g., wear mask, isolate, social distance, avoid sharing personal items, clean and disinfect "high touch" surfaces, and frequent handwashing) according to CDC guidelines.   5. The patient or parent/caregiver has the option to accept or refuse COVID monoclonal antibody treatment.  After reviewing this information with the patient, the  patient has agreed to receive one of the available covid 19 monoclonal antibodies and will be provided an appropriate fact sheet prior to infusion.  Cyndee Brightly, NP [facility]

## 2019-12-18 ENCOUNTER — Ambulatory Visit (HOSPITAL_COMMUNITY): Payer: Medicaid Other | Attending: Pulmonary Disease

## 2020-06-13 ENCOUNTER — Encounter (HOSPITAL_COMMUNITY): Payer: Self-pay

## 2020-06-13 ENCOUNTER — Other Ambulatory Visit: Payer: Self-pay

## 2020-06-13 DIAGNOSIS — I10 Essential (primary) hypertension: Secondary | ICD-10-CM | POA: Insufficient documentation

## 2020-06-13 DIAGNOSIS — K0889 Other specified disorders of teeth and supporting structures: Secondary | ICD-10-CM | POA: Insufficient documentation

## 2020-06-13 DIAGNOSIS — Z87891 Personal history of nicotine dependence: Secondary | ICD-10-CM | POA: Diagnosis not present

## 2020-06-13 NOTE — ED Triage Notes (Signed)
Left lower dental pain started today. Has happened in the past and was put on abx.

## 2020-06-14 ENCOUNTER — Emergency Department (HOSPITAL_COMMUNITY)
Admission: EM | Admit: 2020-06-14 | Discharge: 2020-06-14 | Disposition: A | Discharge status: Home / Self Care | Payer: Medicaid Other | Attending: Emergency Medicine | Admitting: Emergency Medicine

## 2020-06-14 DIAGNOSIS — K0889 Other specified disorders of teeth and supporting structures: Secondary | ICD-10-CM

## 2020-06-14 MED ORDER — IBUPROFEN 800 MG PO TABS: 800.0000 mg | ORAL_TABLET | Freq: Three times a day (TID) | ORAL | 0 refills | Status: DC

## 2020-06-14 MED ORDER — IBUPROFEN 400 MG PO TABS
400.0000 mg | ORAL_TABLET | Freq: Once | ORAL | Status: AC
  Administered 2020-06-14: 400 mg via ORAL
  Filled 2020-06-14: qty 1

## 2020-06-14 MED ORDER — PENICILLIN V POTASSIUM 500 MG PO TABS: 500.0000 mg | ORAL_TABLET | Freq: Four times a day (QID) | ORAL | 0 refills | Status: AC

## 2020-06-14 MED ORDER — PENICILLIN V POTASSIUM 250 MG PO TABS
500.0000 mg | ORAL_TABLET | Freq: Once | ORAL | Status: AC
  Administered 2020-06-14: 500 mg via ORAL
  Filled 2020-06-14: qty 2

## 2020-06-14 NOTE — ED Provider Notes (Signed)
[facility] EMERGENCY DEPARTMENT Provider Note   CSN: [id number] Arrival date & time: 06/13/20  2250     History Chief Complaint  Patient presents with  . Dental Pain    Benjamin Bryant is a 20 y.o. male.  Patient with left-sided lower dental pain for the past day.  States he notes his teeth are in poor repair may hurt him on and off for quite some time.  Left lower jaw has been hurting more than usual today.  No difficulty breathing or difficulty swallowing.  No fevers, chills, nausea or vomiting.  States the dentist cannot see him for 2 months.  Denies any abdominal pain.  Denies any bleeding or drainage from the tooth.  The history is provided by the patient.  Dental Pain Associated symptoms: no fever and no headaches        Past Medical History:  Diagnosis Date  . Attention deficit   . Hypertension     Patient Active Problem List   Diagnosis Date Noted  . Severe major depression, single episode, without psychotic features (HCC) 09/29/2019  . Attention deficit hyperactivity disorder (ADHD) 07/16/2015    History reviewed. No pertinent surgical history.     Family History  Problem Relation Age of Onset  . Hypertension Mother   . COPD Father   . COPD Maternal Grandmother   . Hypertension Maternal Grandmother   . Heart disease Maternal Grandmother   . Hypertension Paternal Grandmother   . Cancer Paternal Grandmother     Social History   Tobacco Use  . Smoking status: Never Smoker  . Smokeless tobacco: Former Clinical biochemist  . Vaping Use: Every day  . Substances: Nicotine  Substance Use Topics  . Alcohol use: Not Currently  . Drug use: Not Currently    Types: Marijuana    Home Medications Prior to Admission medications   Medication Sig Start Date End Date Taking? Authorizing Provider  citalopram (CELEXA) 10 MG tablet Take 1 tablet (10 mg total) by mouth daily. For derpression 10/02/19   Armandina Stammer I, NP  hydrOXYzine (ATARAX/VISTARIL) 25 MG tablet  Take 1 tablet (25 mg total) by mouth every 6 (six) hours as needed (anxiety). 10/01/19   Armandina Stammer I, NP  traZODone (DESYREL) 50 MG tablet Take 1 tablet (50 mg total) by mouth at bedtime as needed for sleep. 10/01/19   Sanjuana Kava, NP    Allergies    Patient has no known allergies.  Review of Systems   Review of Systems  Constitutional: Negative for activity change, appetite change and fever.  HENT: Positive for dental problem.   Respiratory: Negative for cough, chest tightness and shortness of breath.   Cardiovascular: Negative for chest pain.  Gastrointestinal: Negative for abdominal pain, nausea and vomiting.  Genitourinary: Negative for dysuria and hematuria.  Musculoskeletal: Negative for arthralgias and myalgias.  Skin: Negative for rash.  Neurological: Negative for dizziness and headaches.   all other systems are negative except as noted in the HPI and PMH.    Physical Exam Updated Vital Signs BP (!) 144/92 (BP Location: Right Arm)   Pulse 96   Temp 98.5 F (36.9 C)   Resp 18   Ht 6\' 2"  (1.88 m)   Wt 93 kg   SpO2 100%   BMI 26.32 kg/m   Physical Exam Vitals and nursing note reviewed.  Constitutional:      General: He is not in acute distress.    Appearance: He is well-developed.  HENT:     Head: Normocephalic and atraumatic.     Mouth/Throat:     Pharynx: No oropharyngeal exudate.     Comments: Teeth in poor repair throughout.  Multiple teeth broken off at the gumline.  Lower molars are diffusely tender on the left without discrete abscess.  Floor mouth is soft. Eyes:     Conjunctiva/sclera: Conjunctivae normal.     Pupils: Pupils are equal, round, and reactive to light.  Neck:     Comments: No meningismus. Cardiovascular:     Rate and Rhythm: Normal rate and regular rhythm.     Heart sounds: Normal heart sounds. No murmur heard.   Pulmonary:     Effort: Pulmonary effort is normal. No respiratory distress.     Breath sounds: Normal breath sounds.   Abdominal:     Palpations: Abdomen is soft.     Tenderness: There is no abdominal tenderness. There is no guarding or rebound.  Musculoskeletal:        General: No tenderness. Normal range of motion.     Cervical back: Normal range of motion and neck supple.  Skin:    General: Skin is warm.  Neurological:     Mental Status: He is alert and oriented to person, place, and time.     Cranial Nerves: No cranial nerve deficit.     Motor: No abnormal muscle tone.     Coordination: Coordination normal.     Comments: No ataxia on finger to nose bilaterally. No pronator drift. 5/5 strength throughout. CN 2-12 intact.Equal grip strength. Sensation intact.   Psychiatric:        Behavior: Behavior normal.     ED Results / Procedures / Treatments   Labs (all labs ordered are listed, but only abnormal results are displayed) Labs Reviewed - No data to display  EKG None  Radiology No results found.  Procedures Procedures   Medications Ordered in ED Medications  penicillin v potassium (VEETID) tablet 500 mg (has no administration in time range)  ibuprofen (ADVIL) tablet 400 mg (has no administration in time range)    ED Course  I have reviewed the triage vital signs and the nursing notes.  Pertinent labs & imaging results that were available during my care of the patient were reviewed by me and considered in my medical decision making (see chart for details).    MDM Rules/Calculators/A&P                         Dental pain with no evidence of Ludwig's angina or drainable abscess.  Treat with antibiotics and non narcotic pain control.  Follow-up with dentistry.  Return precautions discussed. Final Clinical Impression(s) / ED Diagnoses Final diagnoses:  Pain, dental    Rx / DC Orders ED Discharge Orders    None       Tyke Outman, Jeannett Senior, MD 06/14/20 0121

## 2020-06-14 NOTE — Discharge Instructions (Addendum)
Take the antibiotics as prescribed. Followup with the dentist. Return to the ED with new or worsening symptoms.

## 2020-08-03 ENCOUNTER — Other Ambulatory Visit: Payer: Self-pay

## 2020-08-03 ENCOUNTER — Emergency Department (HOSPITAL_COMMUNITY): Payer: Medicaid Other

## 2020-08-03 ENCOUNTER — Emergency Department (HOSPITAL_COMMUNITY)
Admission: EM | Admit: 2020-08-03 | Discharge: 2020-08-03 | Discharge status: Against Medical Advice | Payer: Medicaid Other | Attending: Emergency Medicine | Admitting: Emergency Medicine

## 2020-08-03 ENCOUNTER — Encounter (HOSPITAL_COMMUNITY): Payer: Self-pay | Admitting: *Deleted

## 2020-08-03 DIAGNOSIS — N4889 Other specified disorders of penis: Secondary | ICD-10-CM | POA: Diagnosis not present

## 2020-08-03 DIAGNOSIS — R1084 Generalized abdominal pain: Secondary | ICD-10-CM | POA: Insufficient documentation

## 2020-08-03 DIAGNOSIS — N50812 Left testicular pain: Secondary | ICD-10-CM | POA: Diagnosis not present

## 2020-08-03 DIAGNOSIS — I10 Essential (primary) hypertension: Secondary | ICD-10-CM | POA: Diagnosis not present

## 2020-08-03 DIAGNOSIS — R300 Dysuria: Secondary | ICD-10-CM | POA: Diagnosis present

## 2020-08-03 DIAGNOSIS — R110 Nausea: Secondary | ICD-10-CM | POA: Diagnosis not present

## 2020-08-03 DIAGNOSIS — N433 Hydrocele, unspecified: Secondary | ICD-10-CM

## 2020-08-03 DIAGNOSIS — F17200 Nicotine dependence, unspecified, uncomplicated: Secondary | ICD-10-CM | POA: Insufficient documentation

## 2020-08-03 DIAGNOSIS — R6883 Chills (without fever): Secondary | ICD-10-CM | POA: Insufficient documentation

## 2020-08-03 DIAGNOSIS — N50811 Right testicular pain: Secondary | ICD-10-CM | POA: Diagnosis not present

## 2020-08-03 DIAGNOSIS — N50819 Testicular pain, unspecified: Secondary | ICD-10-CM

## 2020-08-03 LAB — COMPREHENSIVE METABOLIC PANEL
ALT: 13 U/L (ref 0–44)
AST: 19 U/L (ref 15–41)
Albumin: 4.3 g/dL (ref 3.5–5.0)
Alkaline Phosphatase: 40 U/L (ref 38–126)
Anion gap: 6 (ref 5–15)
BUN: 14 mg/dL (ref 6–20)
CO2: 28 mmol/L (ref 22–32)
Calcium: 9 mg/dL (ref 8.9–10.3)
Chloride: 102 mmol/L (ref 98–111)
Creatinine, Ser: 0.89 mg/dL (ref 0.61–1.24)
GFR, Estimated: >60 mL/min (ref >60)
Glucose, Bld: 89 mg/dL (ref 70–99)
Potassium: 4.2 mmol/L (ref 3.5–5.1)
Sodium: 136 mmol/L (ref 135–145)
Total Bilirubin: 0.5 mg/dL (ref 0.3–1.2)
Total Protein: 6.9 g/dL (ref 6.5–8.1)

## 2020-08-03 LAB — CBC WITH DIFFERENTIAL/PLATELET
Abs Immature Granulocytes: 0.02 10*3/uL (ref 0.00–0.07)
Basophils Absolute: 0 10*3/uL (ref 0.0–0.1)
Basophils Relative: 1 %
Eosinophils Absolute: 0.3 10*3/uL (ref 0.0–0.5)
Eosinophils Relative: 5 %
HCT: 46.4 % (ref 39.0–52.0)
Hemoglobin: 15.5 g/dL (ref 13.0–17.0)
Immature Granulocytes: 0 %
Lymphocytes Relative: 28 %
Lymphs Abs: 2.1 10*3/uL (ref 0.7–4.0)
MCH: 29.9 pg (ref 26.0–34.0)
MCHC: 33.4 g/dL (ref 30.0–36.0)
MCV: 89.6 fL (ref 80.0–100.0)
Monocytes Absolute: 0.9 10*3/uL (ref 0.1–1.0)
Monocytes Relative: 12 %
Neutro Abs: 4 10*3/uL (ref 1.7–7.7)
Neutrophils Relative %: 54 %
Platelets: 150 10*3/uL (ref 150–400)
RBC: 5.18 MIL/uL (ref 4.22–5.81)
RDW: 12.7 % (ref 11.5–15.5)
WBC: 7.4 10*3/uL (ref 4.0–10.5)
nRBC: 0 % (ref 0.0–0.2)

## 2020-08-03 LAB — URINALYSIS, ROUTINE W REFLEX MICROSCOPIC
Bacteria, UA: NONE SEEN
Bilirubin Urine: NEGATIVE
Glucose, UA: NEGATIVE mg/dL
Ketones, ur: NEGATIVE mg/dL
Leukocytes,Ua: NEGATIVE
Nitrite: NEGATIVE
Protein, ur: NEGATIVE mg/dL
Specific Gravity, Urine: 1.027 (ref 1.005–1.030)
pH: 6 (ref 5.0–8.0)

## 2020-08-03 NOTE — ED Provider Notes (Signed)
[facility] EMERGENCY DEPARTMENT Provider Note   CSN: [id number] Arrival date & time: 08/03/20  1730     History Chief Complaint  Patient presents with  . Dysuria    Benjamin Bryant is a 20 y.o. male no pertinent past medical history.  Patient presents with chief complaint of dysuria.  Patient reports that dysuria started on Sunday.  [place] hours that the day.  Patient reports that his dysuria has been getting progressively worse.  Patient is having dysuria with every urination.  Patient also endorses penile discharge.  Patient describes this discharge as clear.  Pain on discharge also began on Sunday.  Patient endorses tenderness to penis and both testicles.  Tenderness is to tip of patient's penis and throughout bilateral testicles.  Patient rates pain 6/10.  Pain is constant.  Pain is worse with touch.  No alleviating factors.  Patient also endorses generalized abdominal pain and right flank pain.  This pain began on Sunday.  Pain is worse with palpation.  Patient denies any alleviating factors.  Patient rates pain 6/10 on the pain scale.  Patient reports that pain is separate and does not radiate from site to site.  She endorses nausea and chills.  Patient denies any fevers, vomiting, genital sores or lesions, hematuria, urinary frequency, urinary urgency.  Patient is sexually active in a mutually monogamous relationship with a male partner.  He endorses using condoms when having sex.  HPI     Past Medical History:  Diagnosis Date  . Attention deficit   . Hypertension     Patient Active Problem List   Diagnosis Date Noted  . Severe major depression, single episode, without psychotic features (HCC) 09/29/2019  . Attention deficit hyperactivity disorder (ADHD) 07/16/2015    History reviewed. No pertinent surgical history.     Family History  Problem Relation Age of Onset  . Hypertension Mother   .  COPD Father   . COPD Maternal Grandmother   . Hypertension Maternal Grandmother   . Heart disease Maternal Grandmother   . Hypertension Paternal Grandmother   . Cancer Paternal Grandmother     Social History   Tobacco Use  . Smoking status: Never Smoker  . Smokeless tobacco: Former Clinical biochemist  . Vaping Use: Every day  . Substances: Nicotine  Substance Use Topics  . Alcohol use: Not Currently  . Drug use: Not Currently    Types: Marijuana    Home Medications Prior to Admission medications   Medication Sig Start Date End Date Taking? Authorizing Provider  citalopram (CELEXA) 10 MG tablet Take 1 tablet (10 mg total) by mouth daily. For derpression 10/02/19   Armandina Stammer I, NP  hydrOXYzine (ATARAX/VISTARIL) 25 MG tablet Take 1 tablet (25 mg total) by mouth every 6 (six) hours as needed (anxiety). 10/01/19   Armandina Stammer I, NP  ibuprofen (ADVIL) 800 MG tablet Take 1 tablet (800 mg total) by mouth 3 (three) times daily. 06/14/20   Rancour, Jeannett Senior, MD  traZODone (DESYREL) 50 MG tablet Take 1 tablet (50 mg total) by mouth at bedtime as needed for sleep. 10/01/19   Sanjuana Kava, NP    Allergies    Patient has no known allergies.  Review of Systems   Review of Systems  Constitutional: Positive for chills. Negative for fever.  Respiratory: Negative for shortness of breath.   Cardiovascular: Negative for chest pain.  Gastrointestinal: Positive for  abdominal pain and nausea. Negative for abdominal distention, anal bleeding, blood in stool, constipation, diarrhea, rectal pain and vomiting.  Genitourinary: Positive for dysuria, flank pain, penile discharge, penile pain and testicular pain. Negative for difficulty urinating, genital sores, hematuria, penile swelling, scrotal swelling and urgency.  Musculoskeletal: Negative for back pain and neck pain.  Skin: Negative for color change and rash.  Allergic/Immunologic: Negative for immunocompromised state.  Neurological: Negative for  dizziness, syncope, light-headedness and headaches.  Psychiatric/Behavioral: Negative for confusion.    Physical Exam Updated Vital Signs BP 133/75 (BP Location: Right Arm)   Pulse 71   Temp 98.4 F (36.9 C) (Oral)   Resp 14   Ht 6\' 2"  (1.88 m)   Wt 95.3 kg   SpO2 100%   BMI 26.96 kg/m   Physical Exam Vitals and nursing note reviewed. Exam conducted with a chaperone present (Male RN present).  Constitutional:      General: He is not in acute distress.    Appearance: He is not ill-appearing, toxic-appearing or diaphoretic.  HENT:     Head: Normocephalic.  Eyes:     General: No scleral icterus.       Right eye: No discharge.        Left eye: No discharge.  Cardiovascular:     Rate and Rhythm: Normal rate.  Pulmonary:     Effort: Pulmonary effort is normal. No respiratory distress.     Breath sounds: No stridor.  Abdominal:     General: Abdomen is flat. Bowel sounds are normal. There is no distension. There are no signs of injury.     Palpations: Abdomen is soft. There is no mass or pulsatile mass.     Tenderness: There is generalized abdominal tenderness. There is right CVA tenderness. There is no left CVA tenderness, guarding or rebound.     Hernia: There is no hernia in the umbilical area, ventral area, left inguinal area or right inguinal area.     Comments: Generalized tenderness throughout abdomen, right CVA tenderness  Patient has sunburn to abdomen no blistering  Genitourinary:    Pubic Area: No rash.      Penis: Circumcised. No erythema, tenderness, discharge, swelling or lesions.      Testes: Cremasteric reflex is present.        Right: Tenderness present. Mass, swelling, testicular hydrocele or varicocele not present. Right testis is descended. Cremasteric reflex is present.         Left: Tenderness present. Mass, swelling, testicular hydrocele or varicocele not present. Left testis is descended. Cremasteric reflex is present.      Epididymis:     Right:  Normal.     Left: Normal.     Tanner stage (genital): 5.     Comments: Patient has diffuse tenderness to bilateral testicles, no swelling or mass; cremasteric reflex intact bilaterally Skin:    General: Skin is warm and dry.     Coloration: Skin is not jaundiced or pale.  Neurological:     General: No focal deficit present.     Mental Status: He is alert.  Psychiatric:        Behavior: Behavior is cooperative.     ED Results / Procedures / Treatments   Labs (all labs ordered are listed, but only abnormal results are displayed) Labs Reviewed  URINALYSIS, ROUTINE W REFLEX MICROSCOPIC - Abnormal; Notable for the following components:      Result Value   APPearance HAZY (*)    Hgb urine dipstick SMALL (*)  All other components within normal limits  URINE CULTURE  COMPREHENSIVE METABOLIC PANEL  CBC WITH DIFFERENTIAL/PLATELET  GC/CHLAMYDIA PROBE AMP ([place]) NOT AT [facility]    EKG None  Radiology No results found.  Procedures Procedures   Medications Ordered in ED Medications - No data to display  ED Course  I have reviewed the triage vital signs and the nursing notes.  Pertinent labs & imaging results that were available during my care of the patient were reviewed by me and considered in my medical decision making (see chart for details).    MDM Rules/Calculators/A&P                          Alert 20 year old male no acute distress, nontoxic-appearing.  Patient presents with chief complaint of dysuria.  Patient also endorses abdominal pain, right flank pain, testicular pain, penile tenderness, penile discharge.  Patient reports he is sexually active in remission monogamous relationship and uses condoms for protection.  Patient is afebrile, hemodynamically stable and in no acute distress.  On physical exam patient has diffuse tenderness throughout abdomen.  Abdomen soft, nondistended.  Right CVA tenderness.  Patient complains of diffuse tenderness to bilateral  testicles and penis.  Patient has no erythema, swelling, mass, sores or lesions noted to penis and scrotum.  Cremasteric reflex intact bilaterally.  Will obtain urinalysis, gonorrhea chlamydia testing, CMP, CBC, and testicular ultrasound.  Will defer CT abdomen pelvis until results from CBC and CMP are obtained.  Patient will likely need empiric treatment for STI due to his dysuria and penile discharge.  Testicular ultrasound shows small bilateral hydroceles, otherwise unremarkable examination.  Urinalysis, CMP, and CBC pending.  Patient care transferred to PA [facility] at the end of my shift. Patient presentation, ED course, and plan of care discussed with review of all pertinent labs and imaging. Please see his/her note for further details regarding further ED course and disposition.   Final Clinical Impression(s) / ED Diagnoses Final diagnoses:  None    Rx / DC Orders ED Discharge Orders    None       Berneice Heinrich 08/03/20 2135    Eber Hong, MD 08/04/20 Zollie Pee

## 2020-08-03 NOTE — ED Notes (Signed)
Called lab to inquire about labs being drawn. Told a phlebotomist would be coming to draw.

## 2020-08-03 NOTE — ED Triage Notes (Signed)
Pt with burning with urination for past 2 days. Denies any discharge.

## 2020-08-03 NOTE — ED Provider Notes (Signed)
20 year old male with genital pain, awaiting [place], labs. See prior note for complete H&P. Likely dc with abx for STI coverage.  Physical Exam  BP 125/75 (BP Location: Right Arm)   Pulse 71   Temp 98.6 F (37 C) (Oral)   Resp 16   Ht 6\' 2"  (1.88 m)   Wt 95.3 kg   SpO2 98%   BMI 26.96 kg/m   Physical Exam  ED Course/Procedures     Procedures  MDM  Informed by nurse, patient has eloped.       , PA-C 08/03/20 2056    2057, MD 08/04/20 08/06/20

## 2020-08-04 LAB — GC/CHLAMYDIA PROBE AMP (~~LOC~~) NOT AT ARMC
Chlamydia: NEGATIVE
Comment: NEGATIVE
Comment: NORMAL
Neisseria Gonorrhea: NEGATIVE

## 2020-08-05 LAB — URINE CULTURE: Culture: <10000 — AB

## 2021-04-03 ENCOUNTER — Encounter (HOSPITAL_COMMUNITY): Payer: Self-pay

## 2021-04-03 ENCOUNTER — Other Ambulatory Visit: Payer: Self-pay

## 2021-04-03 ENCOUNTER — Emergency Department (HOSPITAL_COMMUNITY): Payer: Medicaid Other

## 2021-04-03 ENCOUNTER — Emergency Department (HOSPITAL_COMMUNITY)
Admission: EM | Admit: 2021-04-03 | Discharge: 2021-04-03 | Disposition: A | Discharge status: Home / Self Care | Payer: Medicaid Other | Attending: Emergency Medicine | Admitting: Emergency Medicine

## 2021-04-03 DIAGNOSIS — M546 Pain in thoracic spine: Secondary | ICD-10-CM | POA: Diagnosis not present

## 2021-04-03 DIAGNOSIS — W0110XA Fall on same level from slipping, tripping and stumbling with subsequent striking against unspecified object, initial encounter: Secondary | ICD-10-CM | POA: Diagnosis not present

## 2021-04-03 DIAGNOSIS — W19XXXA Unspecified fall, initial encounter: Secondary | ICD-10-CM

## 2021-04-03 DIAGNOSIS — M549 Dorsalgia, unspecified: Secondary | ICD-10-CM

## 2021-04-03 DIAGNOSIS — M545 Low back pain, unspecified: Secondary | ICD-10-CM | POA: Diagnosis present

## 2021-04-03 DIAGNOSIS — Z79899 Other long term (current) drug therapy: Secondary | ICD-10-CM | POA: Diagnosis not present

## 2021-04-03 MED ORDER — METHOCARBAMOL 500 MG PO TABS: 500.0000 mg | ORAL_TABLET | Freq: Two times a day (BID) | ORAL | 0 refills | Status: AC

## 2021-04-03 MED ORDER — LIDOCAINE 5 % EX PTCH
1.0000 | MEDICATED_PATCH | CUTANEOUS | 0 refills | Status: AC
Start: 2021-04-03 — End: ?

## 2021-04-03 MED ORDER — DICLOFENAC SODIUM 50 MG PO TBEC: 50.0000 mg | DELAYED_RELEASE_TABLET | Freq: Two times a day (BID) | ORAL | 0 refills | Status: AC

## 2021-04-03 NOTE — ED Provider Notes (Signed)
[facility] EMERGENCY DEPARTMENT Provider Note   CSN: [id number] Arrival date & time: 04/03/21  1251     History  Chief Complaint  Patient presents with   Back Pain    Benjamin Bryant is a 21 y.o. male.  21 year old male with complaint of low back pain after he slipped and fell, landing on his buttocks at a gas station last night. Reports prior lumbar fracture.  Patient is ambulatory without difficulty, denies abdominal pain, hematuria, other injuries, complaints or concerns.      Home Medications Prior to Admission medications   Medication Sig Start Date End Date Taking? Authorizing Provider  diclofenac (VOLTAREN) 50 MG EC tablet Take 1 tablet (50 mg total) by mouth 2 (two) times daily for 10 days. 04/03/21 04/13/21 Yes Tacy Learn, PA-C  lidocaine (LIDODERM) 5 % Place 1 patch onto the skin daily. Remove & Discard patch within 12 hours or as directed by MD 04/03/21  Yes Tacy Learn, PA-C  methocarbamol (ROBAXIN) 500 MG tablet Take 1 tablet (500 mg total) by mouth 2 (two) times daily. 04/03/21  Yes Tacy Learn, PA-C  citalopram (CELEXA) 10 MG tablet Take 1 tablet (10 mg total) by mouth daily. For derpression 10/02/19   Lindell Spar I, NP  hydrOXYzine (ATARAX/VISTARIL) 25 MG tablet Take 1 tablet (25 mg total) by mouth every 6 (six) hours as needed (anxiety). 10/01/19   Lindell Spar I, NP  traZODone (DESYREL) 50 MG tablet Take 1 tablet (50 mg total) by mouth at bedtime as needed for sleep. 10/01/19   Encarnacion Slates, NP      Allergies    Patient has no known allergies.    Review of Systems   Review of Systems  Constitutional:  Negative for fever.  Gastrointestinal:  Negative for abdominal pain.  Musculoskeletal:  Positive for back pain. Negative for gait problem.  Skin:  Negative for rash and wound.  Allergic/Immunologic: Negative for immunocompromised state.  Neurological:  Negative for weakness and numbness.  Hematological:  Does not bruise/bleed easily.   Psychiatric/Behavioral:  Negative for confusion.   All other systems reviewed and are negative.  Physical Exam Updated Vital Signs Pulse 69    Temp 98.5 F (36.9 C) (Oral)    Resp 17    Ht 6\' 2"  (1.88 m)    Wt 86.2 kg    SpO2 100%    BMI 24.39 kg/m  Physical Exam Vitals and nursing note reviewed.  Constitutional:      General: He is not in acute distress.    Appearance: He is well-developed. He is not diaphoretic.  HENT:     Head: Normocephalic and atraumatic.  Cardiovascular:     Pulses: Normal pulses.  Pulmonary:     Effort: Pulmonary effort is normal.  Musculoskeletal:        General: Tenderness present. No swelling or deformity.     Thoracic back: Bony tenderness present.     Lumbar back: Bony tenderness present.     Comments: Reports midline tenderness from about T11-L5.  No tenderness over pelvis or sacrum.  Skin:    General: Skin is warm and dry.     Findings: No erythema or rash.  Neurological:     Mental Status: He is alert and oriented to person, place, and time.     Sensory: No sensory deficit.     Motor: No weakness.     Gait: Gait normal.  Psychiatric:  Behavior: Behavior normal.    ED Results / Procedures / Treatments   Labs (all labs ordered are listed, but only abnormal results are displayed) Labs Reviewed - No data to display  EKG None  Radiology DG Thoracic Spine 2 View  Result Date: 04/03/2021 CLINICAL DATA:  Patient status post fall. Upper and lower back pain. EXAM: THORACIC SPINE 2 VIEWS COMPARISON:  None. FINDINGS: Mild rightward curvature of the upper thoracic spine. Preservation of the vertebral body and intervertebral disc space heights. No definite evidence for acute fracture. IMPRESSION: No acute osseous abnormality. Electronically Signed   By: Lovey Newcomer M.D.   On: 04/03/2021 14:10   DG Lumbar Spine Complete  Result Date: 04/03/2021 CLINICAL DATA:  Fall.  Pain. EXAM: LUMBAR SPINE - COMPLETE 4+ VIEW COMPARISON:  04/11/2018  FINDINGS: Normal alignment. Chronic mild anterior wedge deformity involving the L1 vertebral body is unchanged from previous exam. The remaining vertebral body heights are well preserved. No signs of acute fracture or dislocation. Disc spaces are well preserved. No significant spondylosis. IMPRESSION: 1. No acute findings. 2. 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 Electronically Signed   By: Kerby Moors M.D.   On: 04/03/2021 14:08    Procedures Procedures    Medications Ordered in ED Medications - No data to display  ED Course/ Medical Decision Making/ A&P                           Medical Decision Making Amount and/or Complexity of Data Reviewed Radiology: ordered.   21 year old male with complaint of low back pain after slip and fall landing on buttocks yesterday.  Did not hit head, no loss of consciousness, no other injuries or concerns.  Leg strength and sensation symmetric.  Ambulatory with steady gait.  Does report midline tenderness from about T11-L5 without step-off or deformity.  XR shows prior known L1 compression deformity, no new/acute bony injury.  Recommend diclofenac, Robaxin, Lidoderm.  Follow-up with PCP if pain persist.        Final Clinical Impression(s) / ED Diagnoses Final diagnoses:  Fall, initial encounter  Acute midline back pain, unspecified back location    Rx / DC Orders ED Discharge Orders          Ordered    methocarbamol (ROBAXIN) 500 MG tablet  2 times daily        04/03/21 1419    diclofenac (VOLTAREN) 50 MG EC tablet  2 times daily        04/03/21 1419    lidocaine (LIDODERM) 5 %  Every 24 hours        04/03/21 1419              Roque Lias 04/03/21 1419    Godfrey Pick, MD 04/04/21 2158

## 2021-04-03 NOTE — Discharge Instructions (Signed)
Take Robaxin and diclofenac as needed as prescribed for pain.  You can also apply Lidoderm patches as prescribed.  Recheck with your primary care provider if pain continues.

## 2021-04-03 NOTE — ED Triage Notes (Signed)
Patient states that he slipped on water last night and fell. Complaining of lower back/coccyx pain.

## 2021-11-02 ENCOUNTER — Emergency Department (HOSPITAL_COMMUNITY)
Admission: EM | Admit: 2021-11-02 | Discharge: 2021-11-02 | Discharge status: Against Medical Advice | Payer: Medicaid Other | Source: Home / Self Care

## 2021-11-03 ENCOUNTER — Encounter (HOSPITAL_COMMUNITY): Payer: Self-pay

## 2023-10-30 IMAGING — DX DG THORACIC SPINE 2V
4 series · 4 of 4 positions shown · non-contrast
Comparison: None.

CLINICAL DATA: Patient status post fall. Upper and lower back pain.

EXAM:
THORACIC SPINE 2 VIEWS

[t-spine ap (1 of 2)]
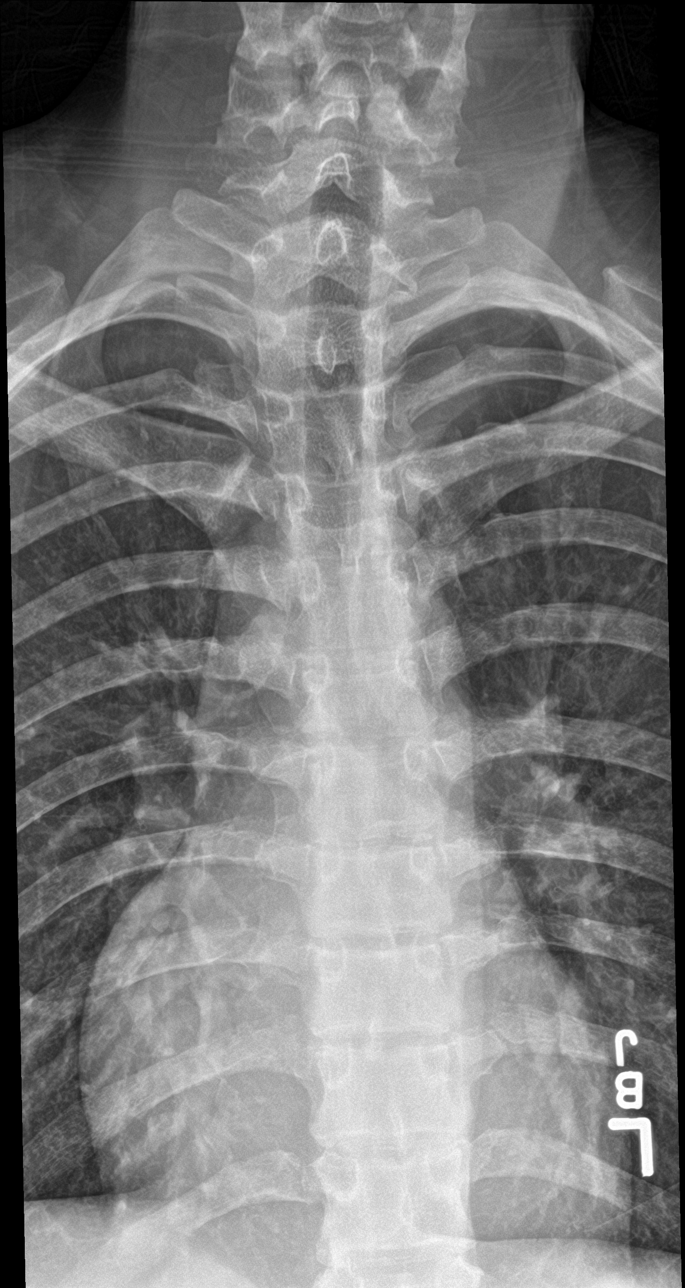

[t-spine lat]
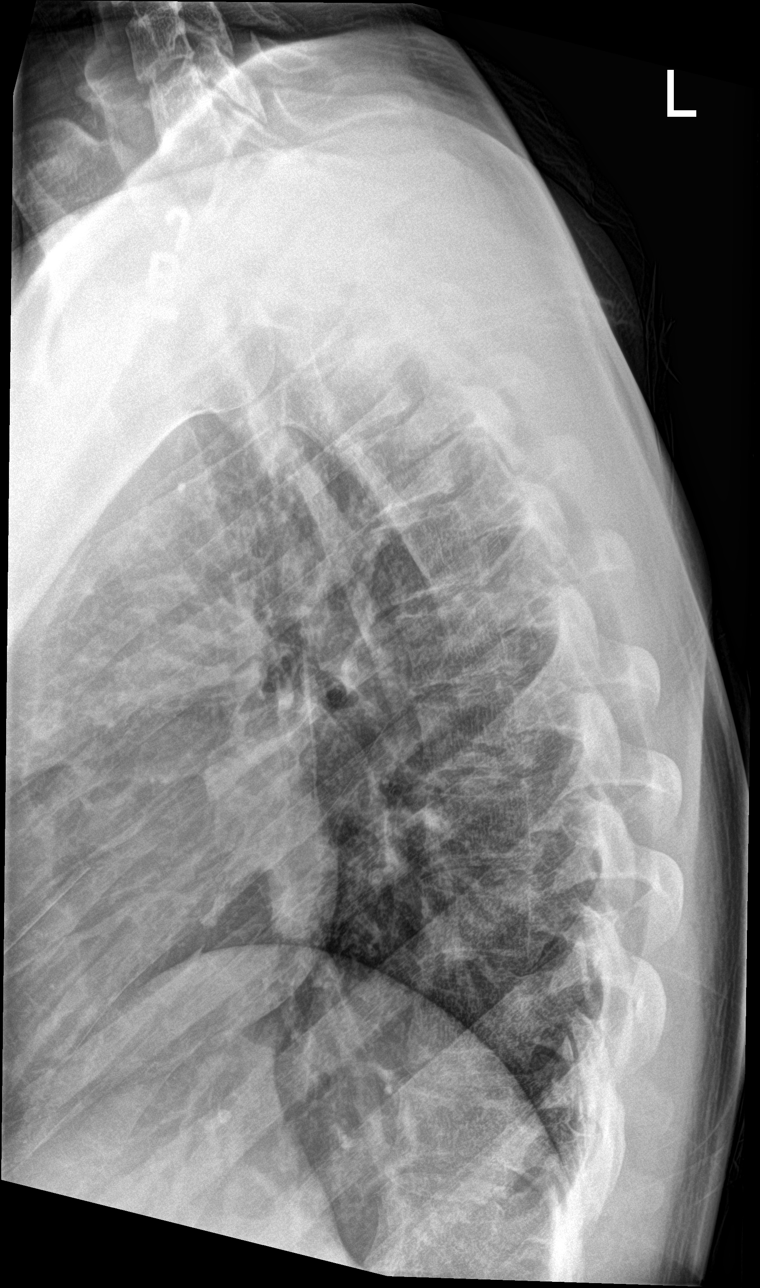

[t-spine swimmers]
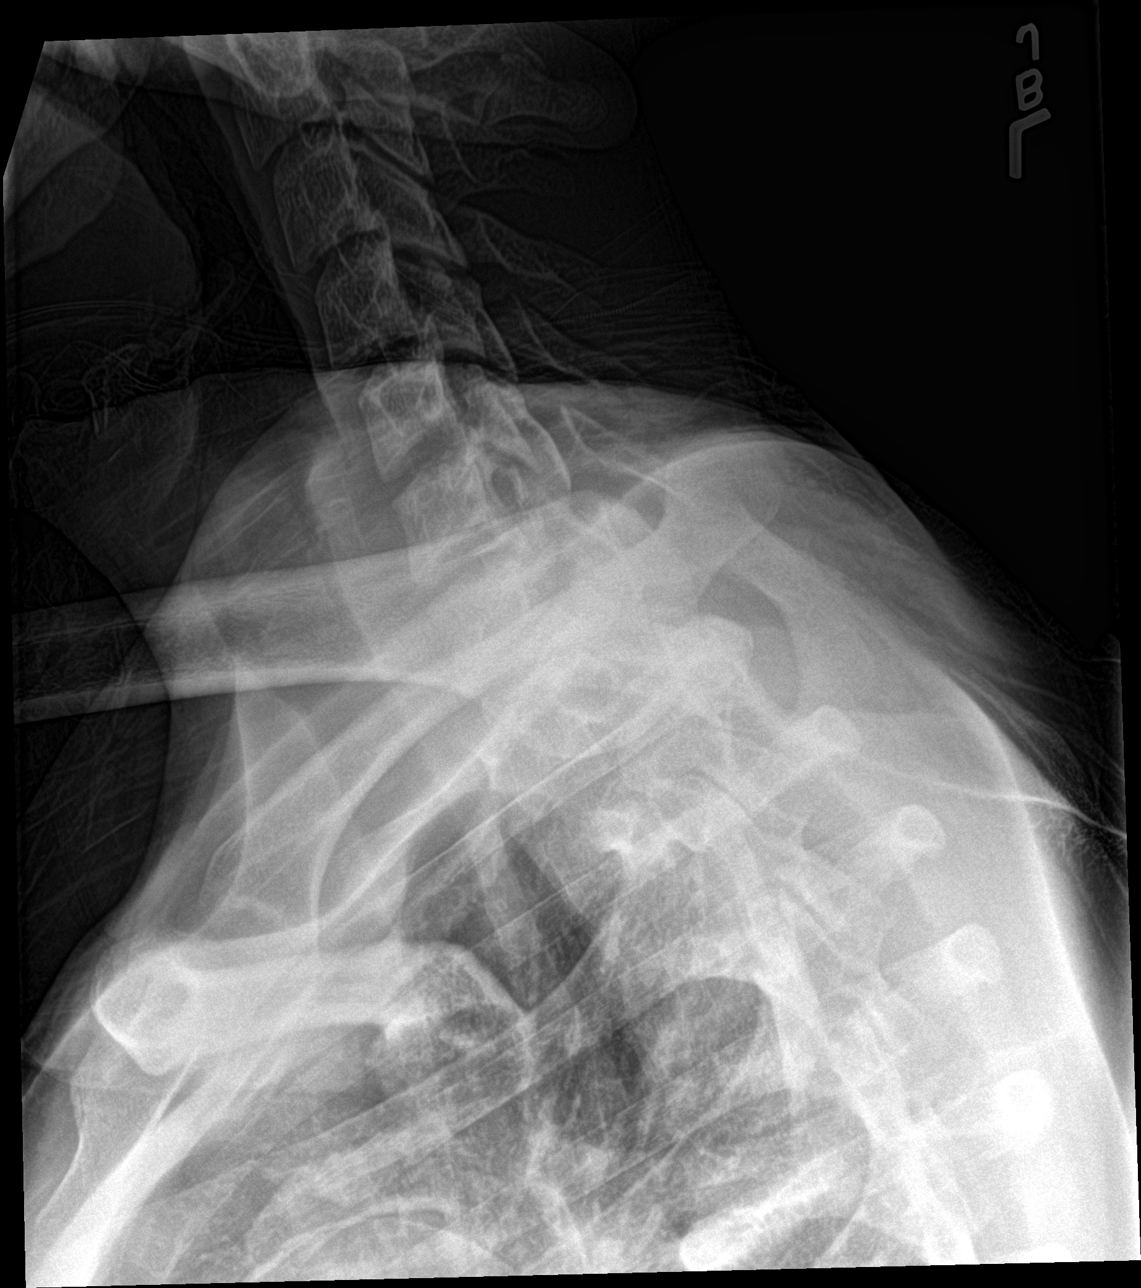

[t-spine ap (2 of 2)]
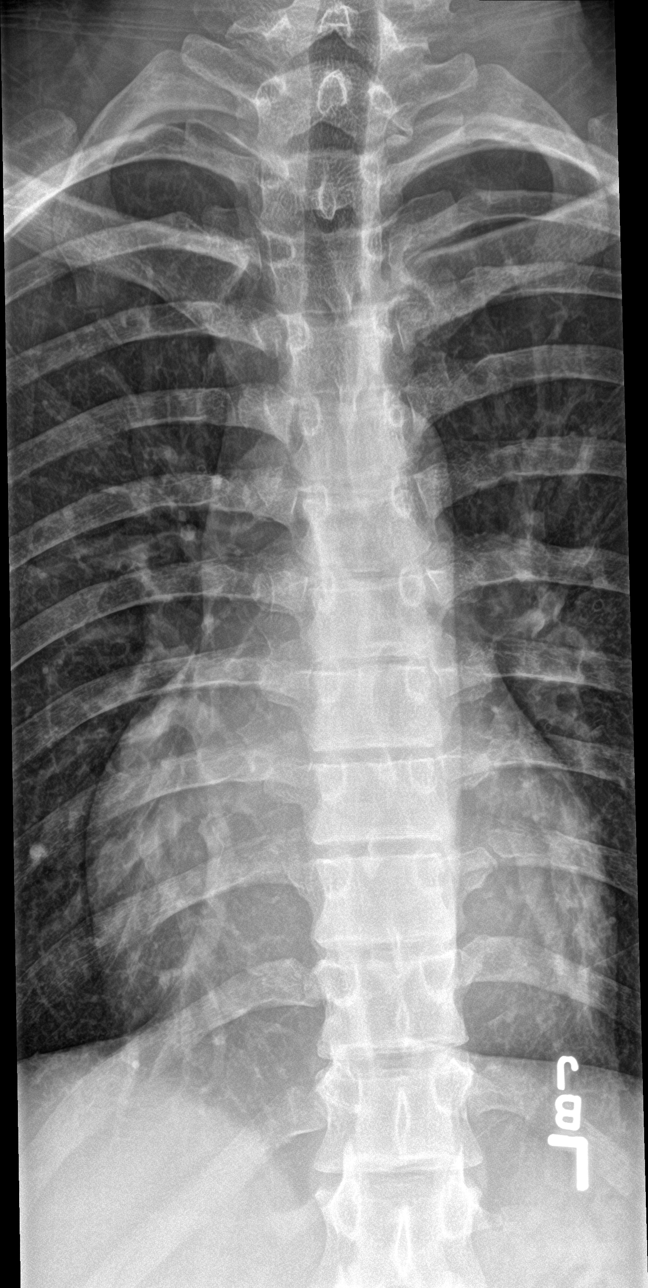

[4 of 4 positions shown; findings below may reference images not displayed]

FINDINGS: Mild rightward curvature of the upper thoracic spine. Preservation
of the vertebral body and intervertebral disc space heights. No
definite evidence for acute fracture.
IMPRESSION: No acute osseous abnormality.
